# Patient Record
Sex: Female | Born: 1975 | Race: White | Hispanic: No | Marital: Married | Smoking: Never smoker
Health system: Southern US, Community
[De-identification: ages and names within clinical notes are randomized; demographics above are authoritative.]

## PROBLEM LIST (undated history)

## (undated) DIAGNOSIS — D869 Sarcoidosis, unspecified: Secondary | ICD-10-CM

## (undated) DIAGNOSIS — M1990 Unspecified osteoarthritis, unspecified site: Secondary | ICD-10-CM

## (undated) DIAGNOSIS — K219 Gastro-esophageal reflux disease without esophagitis: Secondary | ICD-10-CM

## (undated) DIAGNOSIS — J309 Allergic rhinitis, unspecified: Secondary | ICD-10-CM

## (undated) HISTORY — DX: Unspecified osteoarthritis, unspecified site: M19.90

## (undated) HISTORY — DX: Allergic rhinitis, unspecified: J30.9

## (undated) HISTORY — DX: Sarcoidosis, unspecified: D86.9

## (undated) HISTORY — DX: Gastro-esophageal reflux disease without esophagitis: K21.9

---

## 2014-01-10 ENCOUNTER — Ambulatory Visit: Admit: 2014-01-10 | Payer: HMO

## 2015-07-24 ENCOUNTER — Ambulatory Visit: Admitting: Internal Medicine

## 2015-07-24 ENCOUNTER — Ambulatory Visit

## 2015-07-24 NOTE — Progress Notes (Signed)
* * *        **  [name], Brandy**    --- ---    39 Y old Female, DOB: 03/26/1975    [address], [place], [place] [address]    Home: [phone]    Provider: Treyveon Mochizuki, MD        * * *    Telephone Encounter    ---    Answered by   Yu, Yuci  Date: 07/24/2015         Time: 04:10 PM    Reason   need PE    --- ---            Message                      last PE with Dr.Magdelena Kinsella was 01/10/2014.                Action Taken   Yu,Yuci 07/26/2015 3:04:07 PM > called pt, no respond, letter  sent.                * * *         **eMessages**   From:   Yu,Yuci    --- ---    Created:   2015-07-26 15:04:04    Sent:      Subject:   RE:need PE    Message:      SHAWN SHROFF, M.D.    Date: 07/26/2015    Brandy [name]    07/29/1975    [address]    [place] [address]    ???    Dear [name],    As your primary care physician, my goal is to continue to provide you with the  highest quality, safe, effective healthcare available and keep you as healthy  as possible. Not only is it important that I provide the best care possible  when you may become ill, but preventive health maintenance is a vital part of  your overall healthcare as well. A review or your medical record indicates  that you are due for a:    **    Routine Physical    **    Please contact our office at [phone] at your earliest convenience to  schedule an appointment.    Sincerely,    Office of Cordae Mccarey    Quality Team                        ---          * * *          Patient: [name], Brandy DOB: 05/21/1975 Provider: Vander Kueker, MD  07/24/2015    ---    Note generated by eClinicalWorks EMR/PM Software ([email or web address])

## 2015-07-24 NOTE — Progress Notes (Signed)
* * *        **  Teagle, Amelianna**    --- ---    39 Y old Female, DOB: 03/24/1975    [address], [place], [place] [address]    Home: [phone]    Provider: Denajah Farias, MD        * * *    Telephone Encounter    ---    Answered by   Yu, Yuci  Date: 07/24/2015         Time: 04:10 PM    Reason   need PE    --- ---            Message                      last PE with Dr.Lavaughn Bisig was 01/10/2014.                Action Taken   Yu,Yuci 07/26/2015 3:04:07 PM > called pt, no respond, letter  sent.                * * *         **eMessages**   From:   Yu,Yuci    --- ---    Created:   2015-07-26 15:04:04    Sent:      Subject:   RE:need PE    Message:      SHAWN SHROFF, M.D.    Date: 07/26/2015    Abcde Cousar    06/19/1975    [address]    [place] [address]    ???    Dear Karmina Alemu,    As your primary care physician, my goal is to continue to provide you with the  highest quality, safe, effective healthcare available and keep you as healthy  as possible. Not only is it important that I provide the best care possible  when you may become ill, but preventive health maintenance is a vital part of  your overall healthcare as well. A review or your medical record indicates  that you are due for a:    **    Routine Physical    **    Please contact our office at [phone] at your earliest convenience to  schedule an appointment.    Sincerely,    Office of Yehoshua Vitelli    Quality Team                        ---          * * *          Patient: Nored, Jessicia DOB: 12/30/1975 Provider: Bradely Rudin, MD  07/24/2015    ---    Note generated by eClinicalWorks EMR/PM Software ([email or web address])

## 2016-03-14 ENCOUNTER — Ambulatory Visit: Admitting: Obstetrics & Gynecology

## 2016-03-14 ENCOUNTER — Ambulatory Visit

## 2016-03-14 MED ORDER — Apri: Tablet | Freq: Every day | 0 refills | 0 days | Status: AC

## 2016-03-14 NOTE — Progress Notes (Signed)
* * *        **  Brandy [name]**    --- ---    3 Y old Female, DOB: 01-28-1976    [address] [place], [place] [address]    Home: [phone]    Provider: Fredrik Cove, MD        * * *    Telephone Encounter    ---    Answered by   Rodolph Bong  Date: 03/14/2016         Time: 02:25 PM    Reason   rx    --- ---            Action Taken   Bray,Paula 03/14/2016 2:25:35 PM > pt last seen 04/05/15 refill  sent apri with message needs annual            Refills  Start Apri Tablet, 0.15-30 MG-MCG, Orally, 84 Tablet, 1 tablet, Once  a day, 84 days, Refills=0    --- ---          * * *                ---          * * *          [name] DOB: 20-Apr-1975 Provider: Fredrik Cove, MD  03/14/2016    ---    Note generated by eClinicalWorks EMR/PM Software ([email or web address])

## 2016-03-14 NOTE — Progress Notes (Signed)
* * *      **  Brandy [name]**    ------    3 Y old Female, DOB: 1976/03/07    [address] [place], [place] [address]    Home: [phone]    Provider: Fredrik Cove, MD        * * *    Telephone Encounter    ---    Answered by  Rodolph Bong Date: 03/14/2016       Time: 02:25 PM    Reason  rx    ------            Action Taken  Bray,Paula 03/14/2016 2:25:35 PM > pt last seen 04/05/15 refill  sent apri with message needs annual            Refills Start Apri Tablet, 0.15-30 MG-MCG, Orally, 84 Tablet, 1 tablet, Once  a day, 84 days, Refills=0    ------          * * *                ---          * * *         [name] DOB: [birth year] Provider: Fredrik Cove, MD  03/14/2016    ---    Note generated by eClinicalWorks EMR/PM Software ([email or web address])

## 2016-03-21 ENCOUNTER — Ambulatory Visit

## 2016-03-21 ENCOUNTER — Ambulatory Visit: Admitting: Obstetrics & Gynecology

## 2016-03-21 NOTE — Progress Notes (Signed)
* * *        **  Brandy [name]**    --- ---    16 Y old Female, DOB: Mar 27, 1975    [address], [place], [place] [address]    Home: [phone]    Provider: Fredrik Cove, MD        * * *    Telephone Encounter    ---    Answered by   Darien Ramus  Date: 03/21/2016         Time: 10:44 AM    Caller   pt    --- ---            Reason   refill            Message                      pt has appt sched with KJ for 1/26, needs [facility] script sent to pharm in the meantime please   ([facility])                Action Taken   [facility] 03/21/2016 10:45:48 AM > Bray,Paula 03/21/2016  11:12:11 AM > refill apri for 3 months sent 03/14/16                * * *                ---          * * *          Patient: Brandy [name] DOB: 1975-11-24 Provider: Fredrik Cove, MD  03/21/2016    ---    Note generated by eClinicalWorks EMR/PM Software ([email or web address])

## 2016-03-21 NOTE — Progress Notes (Signed)
* * *      **  Brandy [name]**    ------    69 Y old Female, DOB: 11/30/1975    [address] [place], [place] [address]    Home: [phone]    Provider: Fredrik Cove, MD        * * *    Telephone Encounter    ---    Answered by  Darien Ramus Date: 03/21/2016       Time: 10:44 AM    Caller  pt    ------            Reason  refill            Message                     pt has appt sched with KJ for 1/26, needs [facility] script sent to pharm in the meantime please   ([facility]                Action Taken  [facility] 03/21/2016 10:45:48 AM > Bray,Paula 03/21/2016  11:12:11 AM > refill apri for 3 months sent 03/14/16                * * *                ---          * * *         Patient: Brandy [name] DOB: Apr 17, 1975 Provider: Fredrik Cove, MD  03/21/2016    ---    Note generated by eClinicalWorks EMR/PM Software ([email or web address])

## 2016-04-29 ENCOUNTER — Ambulatory Visit: Admit: 2016-04-29 | Payer: HMO

## 2016-04-29 ENCOUNTER — Ambulatory Visit

## 2016-04-29 ENCOUNTER — Ambulatory Visit: Admitting: Obstetrics & Gynecology

## 2016-04-29 MED ORDER — Apri: 90 | Freq: Every day | 3 refills | 0 days | Status: AC

## 2016-04-29 NOTE — Progress Notes (Signed)
* * *        Brandy [name]**    --- ---    41 Y old Female, DOB: June 15, 1975, External MRN: [id number]    Account Number: [id number]    [address] [place], [place]    Home: [phone]    Insurance: HMO [place] [employer] Payer ID: PAPER    PCP: Darcus Pester Referring: Darcus Pester External Visit ID: [id number]    Appointment Facility: [facility]        * * *    04/29/2016   **Appointment Provider:** Fredrik Cove, MD **CHN#:** [phone]    --- ---    ---        Current Medications    ---    Taking     * Apri 0.15-30 MG-MCG Tablet 1 tablet Orally Once a day    ---    * Ibuprofen 400 mg Tablet 1 tablet Orally as needed    ---    * Zyrtec 1 tab Oral     ---    Not-Taking/PRN    * Apri 0.15-30 MG-MCG Tablet 1 tablet Orally Once a day    ---    * Apri 0.15-30 MG-MCG Tablet 1 tablet Orally Once a day    ---    * Apri 0.15-30 MG-MCG Tablet 1 tablet Orally Once a day    ---    * Flonase 50 MCG/ACT Suspension 1 spray in each nostril Nasally Once a day    ---    * Meclizine HCl 25 mg Tablet 1 tablet as needed Orally Once a day    ---    * Medication List reviewed and reconciled with the patient    ---      Past Medical History    ---       Back pain/scoliosis.        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        ---      Surgical History    ---      C/S 04/2005    ---    C/S 12/2009    ---      Family History    ---      Mother: alive, diabetes, hyperlipidemia, hypertension    ---    Father: unknown    ---    Paternal Grand Mother: deceased    ---    Paternal Grand Father: deceased, colon CA, Breast CA, diagnosed with Cancer    ---    1 brother(s) , 1 sister(s) . 1 son(s) , 1 daughter(s) - healthy.    ---    mother-htn,dm    father-htn,drug alcohol    gf-breast cancer/colon    sister and one paternal cousin: kidney disease (heriditary, only one kidney)    denied pre-mature CAD in family.    ---      Social History    ---    Tobacco history: Never smoked.    Marital Status  Married.     Caffeine: drink coffee.    Sexual history  Currently active with your husband only, no Hx of STD.    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    Exercise: gym workouts,spinning- 3-4x/week.    Alcohol  Denies.    Illicit drugs: Denies.    Condom use: Never.    Driver's License/Permit: yes, drives regularly.    Seat Belts: always.  Lives with: Husband and children.   lives with husband and kids (5 and 8 YO  feel safe at home.    ---      Gyn History    ---    H/O Hx Abnormal pap smear 2000-colposcopy & repeat pap nl.    Birth control ocp.    GPAL: G2P2.    Last Pap Smear 03/29/14 negative/hpv negative.      Allergies    ---      Sulfa    ---    seasonal allergies    ---    [Allergies Verified]      Hospitalization/Major Diagnostic Procedure    ---      C/S 04/2005    ---    C/S 12/2009    ---      Review of Systems    ---     _[facility] ROS_ :    General: no fever, no weakness, no memory loss, no swollen glands, no  bruising, no weight loss, no constant fatigue, no physical abuse, no emotional  abuse. Skin: no changing moles, no rash, no jaundice. HEENT: no visual  problems, no eye pain, no ear pain, no difficulty hearing, no headaches, no  sinus trouble. Respiratory: no shortness of breath, no cough , no wheezing.  Breasts: no breast lumps, no breast pain, no nipple discharge. Cardiovascular:  no chest pains, no palpitations, no swelling, no dizziness, no murmurs.  Gastrointestinal: no nausea , no vomiting, no loss of appetite, no  constipation, no diarrhea. Female Genitourinary: __. Musculoskeletal: no joint  pain, no joint swelling, no joint stiffness. Neurological: __. Psychiatric: no  anxiety, no depression.    Patient intake reviewed with patient and scanned into system.        Reason for Appointment    ---      1\. Annual    ---      History of Present Illness    ---     _GENERAL_ :    41 yo married, + SA    bc- ocp    menses light.      Vital Signs    ---    Pain scale 0,  Ht-in 5'8, Wt-lbs 206, BMI 40.23, BP 122/80.      Physical Examination    ---     _[facility] Exam_ :    General: General appearance: well-appearing, no acute distress, Mental status:  alert and oriented, Mood/affect: pleasant. Neck: Thyroid: unremarkable, Neck  mass: none. Chest/Thorax: Breath sounds: clear to auscultation. Heart: Rythm:  regular, Murmurs: none, Rate: normal. Breasts: General: no masses, tenderness,  skin changes, or nipple abnormality, Skin: unremarkable, Axilla: no palpable  masses. Abdomen: General: soft, Tenderness: nontender, Masses: none, Hernia:  absent, Distention: none. Skin: General: warm, moist, no rash. Genitourinary:  External Genitalia: normal, Vagina: normal, pink/moist mucosa, no lesions, no  abnormal discharge, Urethral Meatus: normal, Urethra: normal, Bladder: normal,  Cervix: normal, Uterus: normal, Adnexa/Parametria: normal, Anus/Perinuem:  normal, Groin nodes: normal. Pap done.          Assessments    ---    1\. Encounter for gynecological examination - Z01.419 (Primary)    ---    2\. Oral contraceptive pill surveillance - Z30.41    ---      Treatment    ---       **1\. Encounter for gynecological examination**    Refill Apri Tablet, 0.15-30 MG-MCG, 1 tablet, Orally, Once a day, 90 days, 90,  Refills 3    _LAB: Cytology/Gynecological_    _LAB: HPV DNA_    _IMAGING: Mammogram - BILAT Digital_    ---       Preventive Medicine    ---      Patient instructed to call office within 2-3 weeks if she does not receive  results from today's visit. Explained results letter sent with all tests  performed in office. Patient agrees to call if she does not receive letter.        ---      Follow Up    ---    1 Year    **Appointment Provider:** Fredrik Cove, MD    Electronically signed by Dennison Bulla MD on 04/29/2016 at 11:23 AM EST    Sign off status: Completed        * * [facility]    [address] [place], [place] [address]    Tel: [phone]     Fax: [phone]              * * *          Patient: SHAKITA, KEIR DOB: [birth year] Progress Note: Fredrik Cove,  MD 04/29/2016    ---    Note generated by eClinicalWorks EMR/PM Software ([email or web address])

## 2016-04-29 NOTE — Progress Notes (Signed)
.  Progress Notes  .  Patient: Brandy [name], Brandy [name]  Provider: Fredrik Cove MD  .DOB: 29-Oct-1975 Age: 41 Y Sex: Female  .  PCP: Allean Found F   Date: 04/29/2016  .  --------------------------------------------------------------------------------  .  REASON FOR APPOINTMENT  .  1. Annual  .  HISTORY OF PRESENT ILLNESS  .  GENERAL:   42 yo married, + SAbc- ocpmenses light.  .  CURRENT MEDICATIONS  .  Taking Apri 0.15-30 MG-MCG Tablet 1 tablet Orally Once a day  Taking Ibuprofen 400 mg Tablet 1 tablet Orally as needed  Taking Zyrtec 1 tab Oral  Not-Taking/PRN Apri 0.15-30 MG-MCG Tablet 1 tablet Orally Once a  day  Not-Taking/PRN Apri 0.15-30 MG-MCG Tablet 1 tablet Orally Once a  day  Not-Taking/PRN Apri 0.15-30 MG-MCG Tablet 1 tablet Orally Once a  day  Not-Taking/PRN Flonase 50 MCG/ACT Suspension 1 spray in each  nostril Nasally Once a day  Not-Taking/PRN Meclizine HCl 25 mg Tablet 1 tablet as needed  Orally Once a day  Medication List reviewed and reconciled with the patient  .  PAST MEDICAL HISTORY  .  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  ALLERGIES  .  Sulfa  seasonal allergies  .  SURGICAL HISTORY  .  C/S 04/2005  C/S 12/2009  .  FAMILY HISTORY  .  Mother: alive, diabetes, hyperlipidemia, hypertension  Father: unknown  Paternal Grand Mother: deceased  Paternal Grand Father: deceased, colon CA, Breast CA, diagnosed  with Cancer  1 brother(s) , 1 sister(s) . 1 son(s) , 1 daughter(s) - healthy.  mother-htn,dmfather-htn,drug alcoholgf-breast cancer/colonsister  and one paternal cousin: kidney disease (heriditary, only one  kidney)denied pre-mature CAD in family.  .  SOCIAL HISTORY  .  .  Tobaccohistory:Never smoked.  .  Marital Status Married.  .  Caffeine: drink coffee.  Marland Kitchen  Sexual history Currently active with your husband only, no Hx of  STD.  .  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  Marland Kitchen  Exercise: gym workouts,spinning- 3-4x/week.  .  Alcohol  Denies.  .  Illicit drugs: Denies.  .  Condom use: Never.  .  Driver's License/Permit: yes, drives regularly.  .  Seat Belts: always.  .  Lives with: Husband and children.  .  lives with husband and kids (5 and 8 YO feel safe at home.  .  GYN HISTORY  .  H/O Hx Abnormal pap smear 2000-colposcopy & repeat pap nl  Birth control ocp  GPAL:  G2P2  Last Pap Smear 03/29/14 negative/hpv negative  .  HOSPITALIZATION/MAJOR DIAGNOSTIC PROCEDURE  .  C/S 04/2005  C/S 12/2009  .  REVIEW OF SYSTEMS  .  [facility] ROS:  .  General:    no fever, no weakness, no memory loss, no swollen  glands, no bruising, no weight loss, no constant fatigue, no  physical abuse, no emotional abuse . Skin:    no changing moles,  no rash, no jaundice . HEENT:    no visual problems, no eye pain,  no ear pain, no difficulty hearing, no headaches, no sinus  trouble . Respiratory:    no shortness of breath, no cough , no  wheezing . Breasts:    no breast lumps, no breast pain, no nipple  discharge . Cardiovascular:    no chest pains, no palpitations,  no swelling, no dizziness, no murmurs . Gastrointestinal:    no  nausea , no vomiting,  no loss of appetite, no constipation, no  diarrhea . Female Genitourinary:    __ . Musculoskeletal:    no  joint pain, no joint swelling, no joint stiffness . Neurological:     __ . Psychiatric:    no anxiety, no depression .  Marland Kitchen  Patient intake reviewed with patient and scanned into system.  Marland Kitchen  VITAL SIGNS  .  Pain scale 0, Ht-in 5'8, Wt-lbs 206, BMI 40.23, BP 122/80.  Marland Kitchen  PHYSICAL EXAMINATION  .  [facility] Exam:  General:  General appearance: well-appearing, no acute distress,  Mental status: alert and oriented, Mood/affect: pleasant. Neck:   Thyroid: unremarkable, Neck mass: none. Chest/Thorax:  Breath  sounds: clear to auscultation. Heart:  Rythm: regular, Murmurs:  none, Rate: normal. Breasts:  General: no masses, tenderness,  skin changes, or nipple abnormality, Skin: unremarkable, Axilla:  no palpable  masses. Abdomen:  General: soft, Tenderness:  nontender, Masses: none, Hernia: absent, Distention: none. Skin:   General: warm, moist, no rash. Genitourinary:  External  Genitalia: normal, Vagina: normal, pink/moist mucosa, no lesions,  no abnormal discharge, Urethral Meatus: normal, Urethra: normal,  Bladder: normal, Cervix: normal, Uterus: normal,  Adnexa/Parametria: normal, Anus/Perinuem: normal, Groin nodes:  normal. Pap done.  .  ASSESSMENTS  .  Encounter for gynecological examination - Z01.419 (Primary)  .  Oral contraceptive pill surveillance - Z30.41  .  TREATMENT  .  Encounter for gynecological examination  Refill Apri Tablet, 0.15-30 MG-MCG, 1 tablet, Orally, Once a day,  90 days, 90, Refills 3  LAB: Cytology/Gynecological  .  LAB: HPV DNA  .  Mammogram - BILAT [id number]  .  PREVENTIVE MEDICINE  .  Patient instructed to call office within 2-3 weeks if she does  not receive results from today's visit. Explained results letter  sent with all tests performed in office. Patient agrees to call  if she does not receive letter.  .  FOLLOW UP  .  1 Year  .  Marland Kitchen  Appointment Provider: Fredrik Cove, MD  .  Electronically signed by Dennison Bulla MD on  04/29/2016 at 11:23 AM EST  .  Document electronically signed by Fredrik Cove MD  .

## 2016-05-02 LAB — HX CYTO GYN & NON GYN

## 2016-07-11 ENCOUNTER — Ambulatory Visit: Admitting: Obstetrics & Gynecology

## 2016-07-11 ENCOUNTER — Ambulatory Visit

## 2016-07-11 NOTE — Progress Notes (Signed)
* * *        **  Brandy [name]**    --- ---    78 Y old Female, DOB: 06-09-1975    [address], [place], [place] [address]    Home: [phone]    Provider: Fredrik Cove, MD        * * *    Telephone Encounter    ---    Answered by   Teressa Senter  Date: 07/11/2016         Time: 03:24 PM    Reason   Switch OCP- check with Doristine Church    --- ---            Message                      Patient had annual with Dr. Alona Bene in February, has been getting menstrual headaches and would like to discuss changing OCP                Action Taken   [facility] 07/11/2016 3:24:29 PM > [phone] Puccio,Linda  , RN 07/11/2016 3:46:08 PM > pt has been on the same OCP since 2014 PAN,SUSANNA  , RN 07/14/2016 2:12:33 PM > l/m for pt to [facility] , RN 07/14/2016  2:35:16 PM > spoke to patient, pt report went to her PCP recently for  evaluation of headache, PCP did the full body woke up on her, everything is  fine. PCP believe headache might be d/t her OCP. Pt report she been  experiencing very bad headache while she is on her period. Pt denied any  dizziness or vision changes, however ibuprofen/Tylenol does not relieve the  headache. Advised pt if she is only getting headache while on sugar pills,  might not be d/t OCP, since sugar pills dose not contain hormones. Advised pt  I will check with dr. Alona Bene for her opinion. Maybe having pt try to take OCP  continuously? Parris,Julia 07/15/2016 4:33:10 PM > Discussed with KJ. Wants to  change her to Garnette Scheuermann due to placebos having a low dose of hormones still and  hopefully headaches will resolve. Patient is aware. No questions. Rx sent.            Refills  Start Kariva Tablet, 0.15-0.02/0.01 MG (21/5), Orally, 28, 1 tablet,  Once a day, 28 day(s), Refills=2    --- ---          * * *                ---          * * *          Patient: Brandy [name], Brandy [name] DOB: [birth year] Provider: Fredrik Cove, MD  07/11/2016    ---    Note generated by eClinicalWorks EMR/PM Software  ([email or web address])

## 2016-07-15 MED ORDER — Kariva: 28 | Freq: Every day | 2 refills | 0 days | Status: AC

## 2016-10-06 ENCOUNTER — Ambulatory Visit: Admitting: Obstetrics & Gynecology

## 2016-10-06 ENCOUNTER — Ambulatory Visit

## 2016-10-06 MED ORDER — Kariva: Tablet | Freq: Every day | 2 refills | 0 days | Status: AC

## 2016-10-06 NOTE — Progress Notes (Signed)
* * *        **  Brandy [name]**    --- ---    26 Y old Female, DOB: 09-Jul-1975    [address] [place], [place] [address]    Home: [phone]    Provider: Fredrik Cove, MD        * * *    Telephone Encounter    ---    Answered by   Rodolph Bong  Date: 10/06/2016         Time: 10:21 AM    Reason   refill    --- ---            Action Taken   Bray,Paula 10/06/2016 10:22:13 AM > pt seen for annual 04/29/16.  07/11/16 switched to kariva due to heacaches on placebo. refill sent kariva            Refills  Start Kariva Tablet, 0.15-0.02/0.01 MG (21/5), Orally, 84 Tablet, 1  tablet, Once a day, 84 days, Refills=2    --- ---          * * *                ---          * * *          Patient: Brandy [name], Brandy [name] DOB: 30-Oct-1975 Provider: Fredrik Cove, MD  10/06/2016    ---    Note generated by eClinicalWorks EMR/PM Software ([email or web address])

## 2017-06-11 ENCOUNTER — Ambulatory Visit: Admitting: Obstetrics & Gynecology

## 2017-06-11 ENCOUNTER — Ambulatory Visit

## 2017-06-11 MED ORDER — Kariva: Tablet | Freq: Every day | 0 refills | 0 days | Status: AC

## 2017-06-11 NOTE — Progress Notes (Signed)
* * *        **  Brandy [name]**    --- ---    54 Y old Female, DOB: 1976/03/15    [address] [place], [place] [address]    Home: [phone]    Provider: Fredrik Cove, MD        * * *    Telephone Encounter    ---    Answered by   Jeanene Erb  Date: 06/11/2017         Time: 01:52 PM    Reason   [facility] from [facility]    --- ---            Action Taken                      Rubin,Danielle , RN 06/11/2017 1:52:27 PM > Rx sent for one month supply. Pt due for annual.                Refills  Continue Kariva Tablet, 0.15-0.02/0.01 MG (21/5), Orally, 28 Tablet,  1 tablet, Once a day, 28 days, Refills=0    --- ---          * * *                ---          * * *          Patient: Brandy [name], Brandy [name] DOB: 03-12-1976 Provider: Fredrik Cove, MD  06/11/2017    ---    Note generated by eClinicalWorks EMR/PM Software ([email or web address])

## 2017-07-07 ENCOUNTER — Ambulatory Visit: Admitting: Obstetrics & Gynecology

## 2017-07-07 MED ORDER — Kariva: Tablet | Freq: Every day | 0 refills | 0 days | Status: AC

## 2017-07-07 NOTE — Progress Notes (Signed)
* * *        **  Brandy [name]**    --- ---    69 Y old Female, DOB: 06/28/1975    [address] [place], [place] [address]    Home: [phone]    Provider: Fredrik Cove, MD        * * *    Telephone Encounter    ---    Answered by   Etheleen Nicks  Date: 07/07/2017         Time: 01:39 PM    Caller   pt    --- ---            Reason   ocp refill            Message                      annual sched on 08/07/17.  please send one more month of Kariva.                Action Taken                      [facility]  07/07/2017 1:40:07 PM >       Rubin,Danielle , RN 07/07/2017 1:44:24 PM > rx sent                Refills  Continue Kariva Tablet, 0.15-0.02/0.01 MG (21/5), Orally, 28 Tablet,  1 tablet, Once a day, 28 days, Refills=0    --- ---          * * *                ---          * * *          Patient: Brandy [name], Brandy [name] DOB: 1976/01/16 Provider: Fredrik Cove, MD  07/07/2017    ---    Note generated by eClinicalWorks EMR/PM Software ([email or web address])

## 2017-08-07 ENCOUNTER — Ambulatory Visit: Admitting: Obstetrics & Gynecology

## 2017-08-07 MED ORDER — Apri: 30 | Freq: Every day | 0 refills | 0 days | Status: AC

## 2017-08-07 NOTE — Progress Notes (Signed)
* * *        **  [name]**    --- ---    44 Y old Female, DOB: 1976-02-26    [address] [place], [place] [address]    Home: [phone]    Provider: Fredrik Cove, MD        * * *    Telephone Encounter    ---    Answered by   Herminio Heads  Date: 08/07/2017         Time: 10:20 AM    Caller   Patient    --- ---            Reason   [facility]            Message                      Patient need refills sent to pharmacy, she is booked with [facility] on 6/7. Phone number and address confirmed.                Action Taken                      Brady,Ashley  08/07/2017 10:20:57 AM > RN      Rubin,Danielle , RN 08/07/2017 10:27:40 AM > one month rx sent                Refills  Continue Apri Tablet, 0.15-30 MG-MCG, Orally, 30, 1 tablet, Once a  day, 30, Refills=0    --- ---          * * *                ---          * * *          Patient: TOMMY, MINICHIELLO DOB: Jun 04, 1975 Provider: Fredrik Cove, MD  08/07/2017    ---    Note generated by eClinicalWorks EMR/PM Software ([email or web address])

## 2017-08-28 ENCOUNTER — Ambulatory Visit: Admitting: Obstetrics & Gynecology

## 2017-08-28 ENCOUNTER — Ambulatory Visit

## 2017-08-28 MED ORDER — Kariva: 90 | Freq: Every day | 3 refills | 0 days | Status: AC

## 2017-08-28 NOTE — Progress Notes (Signed)
* * *        Brandy [name]**    --- ---    42 Y old Female, DOB: 1975/09/17, External MRN: [id number]    Account Number: [id number]    [address] [place], [place]    Home: [phone]    Insurance: HMO [employer] Payer ID: PAPER    PCP: Bernette Mayers, MD Referring: Bernette Mayers, MD External Visit ID:  [id number]    Appointment Facility: [facility]        * * *    08/28/2017   **Appointment Provider:** Fredrik Cove, MD **CHN#:** [phone]    --- ---    ---         **Current Medications**    ---    Taking     * Augmentin     ---    * Benadryl     ---    * Kariva 0.15-0.02/0.01 MG (21/5) Tablet 1 tablet Orally Once a day    ---    * Sudafed , Notes: prn    ---    * Zyrtec 1 tab Oral     ---    Not-Taking/PRN    * Apri 0.15-30 MG-MCG Tablet 1 tablet Orally Once a day    ---    * Apri 0.15-30 MG-MCG Tablet 1 tablet Orally Once a day    ---    * Apri 0.15-30 MG-MCG Tablet 1 tablet Orally Once a day    ---    * Apri 0.15-30 MG-MCG Tablet 1 tablet Orally Once a day    ---    * Flonase 50 MCG/ACT Suspension 1 spray in each nostril Nasally Once a day    ---    * Ibuprofen 400 mg Tablet 1 tablet Orally as needed    ---    * Kariva 0.15-0.02/0.01 MG (21/5) Tablet 1 tablet Orally Once a day    ---    * Meclizine HCl 25 mg Tablet 1 tablet as needed Orally Once a day    ---    * Medication List reviewed and reconciled with the patient    ---      Past Medical History    ---       Back pain/scoliosis.        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        ---    Sinusitis.        ---       **Surgical History**    ---       C/S 04/2005    ---    C/S 12/2009    ---       **Family History**    ---       Mother: alive, diabetes, hyperlipidemia, hypertension    ---    Father: unknown    ---    Paternal Grand Mother: deceased    ---    Paternal Grand Father: deceased, colon CA, Breast CA, diagnosed with Cancer    ---    1 brother(s) , 1 sister(s) . 1 son(s) , 1 daughter(s) - healthy.     ---    mother-htn,dm    father-htn,drug alcohol    pgf-breast cancer    No Colon CA    sister and one paternal cousin: kidney disease (heriditary, only one kidney)    denied pre-mature CAD in family.    ---       **  Social History**    ---    Tobacco history: Never smoked.    Marital Status  Married.    Caffeine: drink coffee.    Sexual history  Currently active with your husband only, no Hx of STD.    Work/Occupation: Education officer, community office work.    Exercise: gym workouts,spinning- 3-4x/week.    Alcohol  Denies.    Illicit drugs: Denies.    Condom use: Never.    Driver's License/Permit: yes, drives regularly.    Seat Belts: always.    Lives with: Husband and children.   lives with husband and kids (5 and 8 YO  feel safe at home.    ---       **Gyn History**    ---    H/O Hx Abnormal pap smear 2000-colposcopy & repeat pap nl.    Birth control ocp.    GPAL: G2P2.    Last Pap Smear 04/2016- negative/hpv negative.      **Allergies**    ---       Sulfa    ---    seasonal allergies    ---    [Allergies Verified]       **Hospitalization/Major Diagnostic Procedure**    ---       C/S 04/2005    ---    C/S 12/2009    ---       **Review of Systems**    ---     _[facility]_ :    General: no fever, no weakness, no memory loss, no swollen glands, no  bruising, no weight loss, no constant fatigue, no physical abuse, no emotional  abuse. Skin: no changing moles, no rash, no jaundice. HEENT: no visual  problems, no eye pain, no ear pain, no difficulty hearing, no headaches, no  sinus trouble. Respiratory: no shortness of breath, no cough , no wheezing.  Breasts: no breast lumps, no breast pain, no nipple discharge. Cardiovascular:  no chest pains, no palpitations, no swelling, no dizziness, no murmurs.  Gastrointestinal: no nausea , no vomiting, no loss of appetite, no  constipation, no diarrhea. Female Genitourinary: __. Musculoskeletal: no joint  pain, no joint swelling, no joint stiffness. Neurological: __. Psychiatric:  no  anxiety, no depression.    Patient intake reviewed with patient and scanned into system.         **Reason for Appointment**    ---       1\. ANNUAL    ---       **History of Present Illness**    ---     _GENERAL_ :    42 yo married, + SA    bc- ocp    menses light.       **Vital Signs**    ---    Pain scale 0, Ht-in 5'8, Wt-lbs 207, BMI 40.42, BP 122/84, LMP: 3wks.       **Physical Examination**    ---     _[facility] Exam_ :    General: General appearance: well-appearing, no acute distress, Mental status:  alert and oriented, Mood/affect: pleasant. Neck: Thyroid: unremarkable, Neck  mass: none. Chest/Thorax: Breath sounds: clear to auscultation. Heart: Rythm:  regular, Murmurs: none, Rate: normal. Breasts: General: no masses, tenderness,  skin changes, or nipple abnormality, Skin: unremarkable, Axilla: no palpable  masses. Abdomen: General: soft, Tenderness: nontender, Masses: none, Hernia:  absent, Distention: none. Skin: General: warm, moist, no rash. Genitourinary:  External Genitalia: normal, Vagina: normal, pink/moist mucosa, no lesions, no  abnormal discharge, Urethral  Meatus: normal, Urethra: normal, Bladder: normal,  Cervix: normal, Uterus: normal, Adnexa/Parametria: normal, Anus/Perinuem:  normal, Groin nodes: normal. Pap done.          **Assessments**    ---    1\. Encounter for gynecological examination - Z01.419 (Primary)    ---    2\. Oral contraceptive pill surveillance - Z30.41    ---       **Treatment**    ---       **1\. Encounter for gynecological examination**    Refill Kariva Tablet, 0.15-0.02/0.01 MG (21/5), 1 tablet, Orally, Once a day,  90 days, 90, Refills 3    ---       **Preventive Medicine**    ---       Patient instructed to call office within 2-3 weeks if she does not receive  results from today's visit. Explained results letter sent with all tests  performed in office. Patient agrees to call if she does not receive letter.        ---       **Follow Up**    ---    1 Year     **Appointment Provider:** Fredrik Cove, MD    Electronically signed by Dennison Bulla MD on 08/28/2017 at 01:05 PM EDT    Sign off status: Completed        * * [facility]    [address] [place], [place] [address]    Tel: [phone]    Fax: [phone]              * * *          Patient: Brandy [name], Brandy [name] DOB: 04/29/1975 Progress Note: Fredrik Cove,  MD 08/28/2017    ---    Note generated by eClinicalWorks EMR/PM Software ([email or web address])

## 2017-08-28 NOTE — Progress Notes (Signed)
.  Progress Notes  .  Patient: Brandy [name], Brandy [name]  Provider: Fredrik Cove MD  .DOB: [birth year] Age: 42 Y Sex: Female  .  PCP: Bernette Mayers  MD  Date: 08/28/2017  .  --------------------------------------------------------------------------------  .  REASON FOR APPOINTMENT  .  1. ANNUAL  .  HISTORY OF PRESENT ILLNESS  .  GENERAL:   42 yo married, + SAbc- ocpmenses light.  .  CURRENT MEDICATIONS  .  Taking Augmentin  Taking Benadryl  Taking Kariva 0.15-0.02/0.01 MG (21/5) Tablet 1 tablet Orally  Once a day  Taking Sudafed , Notes: prn  Taking Zyrtec 1 tab Oral  Not-Taking/PRN Apri 0.15-30 MG-MCG Tablet 1 tablet Orally Once a  day  Not-Taking/PRN Apri 0.15-30 MG-MCG Tablet 1 tablet Orally Once a  day  Not-Taking/PRN Apri 0.15-30 MG-MCG Tablet 1 tablet Orally Once a  day  Not-Taking/PRN Apri 0.15-30 MG-MCG Tablet 1 tablet Orally Once a  day  Not-Taking/PRN Flonase 50 MCG/ACT Suspension 1 spray in each  nostril Nasally Once a day  Not-Taking/PRN Ibuprofen 400 mg Tablet 1 tablet Orally as needed  Not-Taking/PRN Kariva 0.15-0.02/0.01 MG (21/5) Tablet 1 tablet  Orally Once a day  Not-Taking/PRN Meclizine HCl 25 mg Tablet 1 tablet as needed  Orally Once a day  Medication List reviewed and reconciled with the patient  .  PAST MEDICAL HISTORY  .  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  ALLERGIES  .  Sulfa  seasonal allergies  .  SURGICAL HISTORY  .  C/S 04/2005  C/S 12/2009  .  FAMILY HISTORY  .  Mother: alive, diabetes, hyperlipidemia, hypertension  Father: unknown  Paternal Grand Mother: deceased  Paternal Grand Father: deceased, colon CA, Breast CA, diagnosed  with Cancer  1 brother(s) , 1 sister(s) . 1 son(s) , 1 daughter(s) - healthy.  mother-htn,dmfather-htn,drug alcoholpgf-breast cancerNo Colon CA  sister and one paternal cousin: kidney disease (heriditary, only  one kidney)denied pre-mature CAD in family.  .  SOCIAL HISTORY  .  .  Tobaccohistory:Never  smoked.  .  Marital Status Married.  .  Caffeine: drink coffee.  Marland Kitchen  Sexual history Currently active with your husband only, no Hx of  STD.  .  Work/Occupation: dentist office work.  .  Exercise: gym workouts,spinning- 3-4x/week.  .  Alcohol Denies.  .  Illicit drugs: Denies.  .  Condom use: Never.  .  Driver's License/Permit: yes, drives regularly.  .  Seat Belts: always.  .  Lives with: Husband and children.  .  lives with husband and kids (5 and 8 YO feel safe at home.  .  GYN HISTORY  .  H/O Hx Abnormal pap smear 2000-colposcopy & repeat pap nl  Birth control ocp  GPAL:  G2P2  Last Pap Smear 04/2016- negative/hpv negative  .  HOSPITALIZATION/MAJOR DIAGNOSTIC PROCEDURE  .  C/S 04/2005  C/S 12/2009  .  REVIEW OF SYSTEMS  .  [facility] ROS:  .  General:    no fever, no weakness, no memory loss, no swollen  glands, no bruising, no weight loss, no constant fatigue, no  physical abuse, no emotional abuse . Skin:    no changing moles,  no rash, no jaundice . HEENT:    no visual problems, no eye pain,  no ear pain, no difficulty hearing, no headaches, no sinus  trouble . Respiratory:    no shortness of breath, no cough , no  wheezing . Breasts:  no breast lumps, no breast pain, no nipple  discharge . Cardiovascular:    no chest pains, no palpitations,  no swelling, no dizziness, no murmurs . Gastrointestinal:    no  nausea , no vomiting, no loss of appetite, no constipation, no  diarrhea . Female Genitourinary:    __ . Musculoskeletal:    no  joint pain, no joint swelling, no joint stiffness . Neurological:     __ . Psychiatric:    no anxiety, no depression .  Marland Kitchen  Patient intake reviewed with patient and scanned into system.  Marland Kitchen  VITAL SIGNS  .  Pain scale 0, Ht-in 5'8, Wt-lbs 207, BMI 40.42, BP 122/84, LMP:  3wks.  .  PHYSICAL EXAMINATION  .  [facility] Exam:  General:  General appearance: well-appearing, no acute distress,  Mental status: alert and oriented, Mood/affect: pleasant. Neck:   Thyroid:  unremarkable, Neck mass: none. Chest/Thorax:  Breath  sounds: clear to auscultation. Heart:  Rythm: regular, Murmurs:  none, Rate: normal. Breasts:  General: no masses, tenderness,  skin changes, or nipple abnormality, Skin: unremarkable, Axilla:  no palpable masses. Abdomen:  General: soft, Tenderness:  nontender, Masses: none, Hernia: absent, Distention: none. Skin:   General: warm, moist, no rash. Genitourinary:  External  Genitalia: normal, Vagina: normal, pink/moist mucosa, no lesions,  no abnormal discharge, Urethral Meatus: normal, Urethra: normal,  Bladder: normal, Cervix: normal, Uterus: normal,  Adnexa/Parametria: normal, Anus/Perinuem: normal, Groin nodes:  normal. Pap done.  .  ASSESSMENTS  .  Encounter for gynecological examination - Z01.419 (Primary)  .  Oral contraceptive pill surveillance - Z30.41  .  TREATMENT  .  Encounter for gynecological examination  Refill Kariva Tablet, 0.15-0.02/0.01 MG (21/5), 1 tablet, Orally,  Once a day, 90 days, 90, Refills 3  .  PREVENTIVE MEDICINE  .  Patient instructed to call office within 2-3 weeks if she does  not receive results from today's visit. Explained results letter  sent with all tests performed in office. Patient agrees to call  if she does not receive letter.  .  FOLLOW UP  .  1 Year  .  Marland Kitchen  Appointment Provider: Fredrik Cove, MD  .  Electronically signed by Dennison Bulla MD on  08/28/2017 at 01:05 PM EDT  .  Document electronically signed by Fredrik Cove MD  .

## 2017-08-31 ENCOUNTER — Ambulatory Visit

## 2017-11-26 ENCOUNTER — Ambulatory Visit: Admitting: Obstetrics & Gynecology

## 2017-11-26 MED ORDER — Misoprostol: 200 | 2 | 0 refills | 0 days | Status: AC

## 2017-11-26 NOTE — Progress Notes (Signed)
* * *        **  [name]**    --- ---    55 Y old Female, DOB: Aug 02, 1975    [address], [place], [place] [address]    Home: [phone]    Provider: Fredrik Cove, MD        * * *    Telephone Encounter    ---    Answered by   Etheleen Nicks  Date: 11/26/2017         Time: 09:48 AM    Caller   pt    --- ---            Reason   ? about changing ocp            Message                      pt has a question about changing her ocp.  [phone]                Action Taken                      Detar North  11/26/2017 9:48:41 AM >       Mikey [employer] 11/26/2017 9:51:07 AM > pt has had a Mirena in the past, she would like to stop oral ocp and have Mirena reinserted because it's causing her h/a's and she's been having more clots with her periods. Can I have her make an appt. for this? Thank you! Nila Nephew, MD 11/26/2017 11:48:17 AM > She can be scheduled when she has menses, needs Misoprostol as had C/S x 2      [name] , RN 11/26/2017 11:59:05 AM > rx sent, pt transferred to front to make appt.                Refills  Start Misoprostol Tablet, 200 MCG, intravaginally, 2, as directed, 1  tab placed into vaginal night prior to procedure, 1 tab buccual 2 hours prior  to procedure, 2 days, Refills=0    --- ---          * * *                ---          * * *          Patient: Brandy [name], Brandy [name] DOB: 07-27-1975 Provider: Fredrik Cove, MD  11/26/2017    ---    Note generated by eClinicalWorks EMR/PM Software ([email or web address])

## 2017-12-25 ENCOUNTER — Ambulatory Visit: Admitting: Obstetrics & Gynecology

## 2017-12-25 NOTE — Progress Notes (Signed)
* * *        Brandy [name]**    --- ---    42 Y old Female, DOB: [birth year], External MRN: [id number]    Account Number: [id number]    [address] [place], [place]    Home: [phone]    Insurance: HMO [employer] OUT [employer] Payer ID: PAPER    PCP: Bernette Mayers, MD Referring: Bernette Mayers, MD External Visit ID:  [id number]    Appointment Facility: [facility]        * * *    12/25/2017   **Appointment Provider:** Fredrik Cove, MD **CHN#:** [phone]    --- ---    ---         **Current Medications**    ---    Taking     * Augmentin     ---    * Benadryl     ---    * Kariva 0.15-0.02/0.01 MG (21/5) Tablet 1 tablet Orally Once a day    ---    * Misoprostol 200 MCG Tablet as directed intravaginally 1 tab placed into vaginal night prior to procedure, 1 tab buccual 2 hours prior to procedure    ---    * Sudafed , Notes: prn    ---    * Zyrtec 1 tab Oral     ---    Not-Taking/PRN    * Apri 0.15-30 MG-MCG Tablet 1 tablet Orally Once a day    ---    * Apri 0.15-30 MG-MCG Tablet 1 tablet Orally Once a day    ---    * Apri 0.15-30 MG-MCG Tablet 1 tablet Orally Once a day    ---    * Apri 0.15-30 MG-MCG Tablet 1 tablet Orally Once a day    ---    * Flonase 50 MCG/ACT Suspension 1 spray in each nostril Nasally Once a day    ---    * Ibuprofen 400 mg Tablet 1 tablet Orally as needed    ---    * Kariva 0.15-0.02/0.01 MG (21/5) Tablet 1 tablet Orally Once a day    ---    * Meclizine HCl 25 mg Tablet 1 tablet as needed Orally Once a day    ---      Past Medical History    ---       Back pain/scoliosis.        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        ---    Sinusitis.        ---       **Surgical History**    ---       C/S 04/2005    ---    C/S 12/2009    ---       **Family History**    ---       Mother: alive, diabetes, hyperlipidemia, hypertension    ---    Father: unknown    ---    Paternal Grand Mother: deceased    ---    Paternal Grand Father: deceased, colon CA, Breast CA, diagnosed  with Cancer    ---    1 brother(s) , 1 sister(s) . 1 son(s) , 1 daughter(s) - healthy.    ---    mother-htn,dm    father-htn,drug alcohol    pgf-breast cancer    No Colon CA    sister and one paternal cousin: kidney disease (heriditary, only one kidney)  denied pre-mature CAD in family.    ---       **Social History**    ---    Lives with: Husband and children.    Exercise: gym workouts,spinning- 3-4x/week.    Sexual history  Currently active with your husband only, no Hx of STD.    Condom use: Never.    Work/Occupation: Education officer, community office work.    Alcohol  Denies.    Illicit drugs: Denies.    Seat Belts: always.    Driver's License/Permit: yes, drives regularly.    Caffeine: drink coffee.    Marital Status  Married.    Tobacco history: Never smoked.   lives with husband and kids (5 and 8 YO feel  safe at home.    ---       **Allergies**    ---       Sulfa    ---    seasonal allergies    ---    [Allergies Verified]       **Hospitalization/Major Diagnostic Procedure**    ---       C/S 04/2005    ---    C/S 12/2009    ---         **Reason for Appointment**    ---       1\. IUD PLCMNT-MIRENA    ---       **History of Present Illness**    ---     _GENERAL_ :    LMP 10/3    cytotec this AM.       **Vital Signs**    ---    Ht-in 5'8, Wt-lbs 213, BMI 41.59, BP 120/82, BSA 2.02, Wt-kg 96.62, Wt Change  6 lb, LMP: 12/24/17    upt neg.Kem Boroughs.       **Physical Examination**    ---     _OB/Gyn IUD Insertion_ :    Type of IUD inserted: Mirena. Procedure: The patient was counseled on the risk  and benfits of the IUD. She read literature regarding the risks and given  opportunity to ask questions before insertion. She gave her verbal and written  consent prior to insertion., A speculum was placed into the vagina. The cervix  was swabbed with betadine. A single-toothed tenaculum was used to grasp the  anterior lip of the cervix., The Mirena IUD was unable to pass internal os-  Kyleena IUD was inserted as the product instructions. The  inserter was removed  from the uterus and the strings were visible protruding from the cervical os.  The tenaculum was removed from the cervix. The IUD strings were cut. The  speculum was removed. Post procedure: patient tolerated procedure well. Uterus  sounded to: 7 cm. The inserter was removed from the uterus and the strings  were visible protruding from the cervical os approximately: 4 cm.          **Assessments**    ---    1\. Encounter for IUD insertion - Z30.430 (Primary)    ---       **Treatment**    ---       **1\. Encounter for IUD insertion**    _LAB: Urine Pregnancy Test , HCG Qual (UPREG)_     negative    --- ---       **Procedure Codes**    ---       M5784 INSRT LEVONORGESTREL INTRAUTRN SYS    ---    69629 URINE PREGNANCY TEST, Units: 2.00    ---    B2841 Rutha Bouchard,  19.5 mg    ---       **Follow Up**    ---    6 Weeks    **Appointment Provider:** Fredrik Cove, MD    Electronically signed by Dennison Bulla MD on 12/25/2017 at 02:14 PM EDT    Sign off status: Completed        * * [facility]    [address] [place], [place] [address]    Tel: [phone]    Fax: [phone]              * * *          Patient: Brandy [name], Brandy [name] DOB: 16-Jul-1975 Progress Note: Fredrik Cove,  MD 12/25/2017    ---    Note generated by eClinicalWorks EMR/PM Software ([email or web address])

## 2017-12-25 NOTE — Progress Notes (Signed)
.  Progress Notes  .  Patient: Brandy [name], Brandy [name]  Provider: Fredrik Cove MD  .DOB: 06-28-1975 Age: 42 Y Sex: Female  .  PCP: Bernette Mayers  MD  Date: 12/25/2017  .  --------------------------------------------------------------------------------  .  REASON FOR APPOINTMENT  .  1. IUD PLCMNT-MIRENA  .  HISTORY OF PRESENT ILLNESS  .  GENERAL:   LMP 10/3cytotec this AM.  .  CURRENT MEDICATIONS  .  Taking Augmentin  Taking Benadryl  Taking Kariva 0.15-0.02/0.01 MG (21/5) Tablet 1 tablet Orally  Once a day  Taking Misoprostol 200 MCG Tablet as directed intravaginally 1  tab placed into vaginal night prior to procedure, 1 tab buccual 2  hours prior to procedure  Taking Sudafed , Notes: prn  Taking Zyrtec 1 tab Oral  Not-Taking/PRN Apri 0.15-30 MG-MCG Tablet 1 tablet Orally Once a  day  Not-Taking/PRN Apri 0.15-30 MG-MCG Tablet 1 tablet Orally Once a  day  Not-Taking/PRN Apri 0.15-30 MG-MCG Tablet 1 tablet Orally Once a  day  Not-Taking/PRN Apri 0.15-30 MG-MCG Tablet 1 tablet Orally Once a  day  Not-Taking/PRN Flonase 50 MCG/ACT Suspension 1 spray in each  nostril Nasally Once a day  Not-Taking/PRN Ibuprofen 400 mg Tablet 1 tablet Orally as needed  Not-Taking/PRN Kariva 0.15-0.02/0.01 MG (21/5) Tablet 1 tablet  Orally Once a day  Not-Taking/PRN Meclizine HCl 25 mg Tablet 1 tablet as needed  Orally Once a day  .  PAST MEDICAL HISTORY  .  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  ALLERGIES  .  Sulfa  seasonal allergies  .  SURGICAL HISTORY  .  C/S 04/2005  C/S 12/2009  .  FAMILY HISTORY  .  Mother: alive, diabetes, hyperlipidemia, hypertension  Father: unknown  Paternal Grand Mother: deceased  Paternal Grand Father: deceased, colon CA, Breast CA, diagnosed  with Cancer  1 brother(s) , 1 sister(s) . 1 son(s) , 1 daughter(s) - healthy.  mother-htn,dmfather-htn,drug alcoholpgf-breast cancerNo Colon CA  sister and one paternal cousin: kidney disease (heriditary, only  one  kidney)denied pre-mature CAD in family.  .  SOCIAL HISTORY  .  Marland Kitchen  Lives with: Husband and children.  .  Exercise: gym workouts,spinning- 3-4x/week.  Marland Kitchen  Sexual history Currently active with your husband only, no Hx of  STD.  Marland Kitchen  Condom use: Never.  .  Work/Occupation: dentist office work.  .  Alcohol Denies.  .  Illicit drugs: Denies.  .  Seat Belts: always.  .  Driver's License/Permit: yes, drives regularly.  .  Caffeine: drink coffee.  .  Marital Status Married.  .  Tobaccohistory:Never smoked.  Marland Kitchen  lives with husband and kids (5 and 8 YO feel safe at home.  Marland Kitchen  HOSPITALIZATION/MAJOR DIAGNOSTIC PROCEDURE  .  C/S 04/2005  C/S 12/2009  .  VITAL SIGNS  .  Ht-in 5'8, Wt-lbs 213, BMI 41.59, BP 120/82, BSA 2.02, Wt-kg  96.62, Wt Change 6 lb, LMP: 10/3/19upt neg.[place].  .  PHYSICAL EXAMINATION  .  OB/Gyn IUD Insertion:  Type of IUD inserted:  Mirena. Procedure:  The patient was  counseled on the risk and benfits of the IUD. She read literature  regarding the risks and given opportunity to ask questions before  insertion. She gave her verbal and written consent prior to  insertion., A speculum was placed into the vagina. The cervix was  swabbed with betadine. A single-toothed tenaculum was used to  grasp the anterior lip of the cervix.,  The Mirena IUD was unable  to pass internal os- Kyleena IUD was inserted as the product  instructions. The inserter was removed from the uterus and the  strings were visible protruding from the cervical os. The  tenaculum was removed from the cervix. The IUD strings were cut.  The speculum was removed. Post procedure:  patient tolerated  procedure well. Uterus sounded to:  7 cm. The inserter was  removed from the uterus and the strings were visible protruding  from the cervical os approximately:  4 cm.  .  ASSESSMENTS  .  Encounter for IUD insertion - Z30.430 (Primary)  .  TREATMENT  .  Encounter for IUD insertion  LAB: Urine Pregnancy Test , HCG Qual (UPREG)  negative  .  PROCEDURE  CODES  .  Z6109 INSRT LEVONORGESTREL INTRAUTRN SYS  .  81025 URINE PREGNANCY TEST, Units: 2.00  .  J7296 Kyleena, 19.5 mg  .  FOLLOW UP  .  6 Weeks  .  Marland Kitchen  Appointment Provider: Fredrik Cove, MD  .  Electronically signed by Dennison Bulla MD on  12/25/2017 at 02:14 PM EDT  .  Document electronically signed by Fredrik Cove MD  .

## 2018-01-11 ENCOUNTER — Ambulatory Visit: Admitting: Obstetrics & Gynecology

## 2018-01-11 NOTE — Progress Notes (Signed)
* * *        **  Brandy [name]**    --- ---    64 Y old Female, DOB: Jan 30, 1976    [address], [place], [place] [address]    Home: [phone]    Provider: Fredrik Cove, MD        * * *    Telephone Encounter    ---    Answered by   Etheleen Nicks  Date: 01/11/2018         Time: 04:20 PM    Caller   pt    --- ---            Reason   ? about IUD-LMTC 10/21            Message                      pt has a question about her IUD that was placed on 12/25/17.  call her back at [phone]                Action Taken                      [facility]  01/11/2018 4:20:40 PM >       [employer] 01/11/2018 4:38:59 PM > LMTC      Veronesi,Wendy , RN 01/12/2018 2:45:12 PM > pt had IUD placed on 10/4 states she is having dark brown to brown d/c. Advised pt to monitor and to call us if she has any other symptoms or is having concerns                    * * *                ---          * * *          Patient: Brandy [name], Brandy [name] DOB: 26-Sep-1975 Provider: Fredrik Cove, MD  01/11/2018    ---    Note generated by eClinicalWorks EMR/PM Software ([email or web address])

## 2018-01-12 ENCOUNTER — Ambulatory Visit

## 2018-02-05 ENCOUNTER — Ambulatory Visit: Admitting: Obstetrics & Gynecology

## 2018-02-05 NOTE — Progress Notes (Signed)
.  Progress Notes  .  Patient: Brandy [name], Brandy [name]  Provider: Fredrik Cove MD  .DOB: 07/04/1975 Age: 42 Y Sex: Female  .  PCP: Bernette Mayers  MD  Date: 02/05/2018  .  --------------------------------------------------------------------------------  .  HISTORY OF PRESENT ILLNESS  .  GENERAL:   42yo had Kyleena placed 10/4LMP 11/12 very light.  .  CURRENT MEDICATIONS  .  Taking Augmentin  Taking Benadryl  Taking Kyleena  Taking Sudafed , Notes: prn  Taking Zyrtec 1 tab Oral  Not-Taking/PRN Apri 0.15-30 MG-MCG Tablet 1 tablet Orally Once a  day  Not-Taking/PRN Apri 0.15-30 MG-MCG Tablet 1 tablet Orally Once a  day  Not-Taking/PRN Apri 0.15-30 MG-MCG Tablet 1 tablet Orally Once a  day  Not-Taking/PRN Apri 0.15-30 MG-MCG Tablet 1 tablet Orally Once a  day  Not-Taking/PRN Flonase 50 MCG/ACT Suspension 1 spray in each  nostril Nasally Once a day  Not-Taking/PRN Ibuprofen 400 mg Tablet 1 tablet Orally as needed  Not-Taking/PRN Kariva 0.15-0.02/0.01 MG (21/5) Tablet 1 tablet  Orally Once a day  Not-Taking/PRN Kariva 0.15-0.02/0.01 MG (21/5) Tablet 1 tablet  Orally Once a day  Not-Taking/PRN Meclizine HCl 25 mg Tablet 1 tablet as needed  Orally Once a day  Not-Taking/PRN Mirena  Not-Taking/PRN Misoprostol 200 MCG Tablet as directed  intravaginally 1 tab placed into vaginal night prior to  procedure, 1 tab buccual 2 hours prior to procedure  Medication List reviewed and reconciled with the patient  .  PAST MEDICAL HISTORY  .  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  ALLERGIES  .  Sulfa  seasonal allergies  .  SURGICAL HISTORY  .  C/S 04/2005  C/S 12/2009  .  FAMILY HISTORY  .  Mother: alive, diabetes, hyperlipidemia, hypertension  Father: unknown  Paternal Grand Mother: deceased  Paternal Grand Father: deceased, colon CA, Breast CA, diagnosed  with Cancer  1 brother(s) , 1 sister(s) . 1 son(s) , 1 daughter(s) - healthy.  mother-htn,dmfather-htn,drug alcoholpgf-breast cancerNo  Colon CA  sister and one paternal cousin: kidney disease (heriditary, only  one kidney)denied pre-mature CAD in family.  .  SOCIAL HISTORY  .  Marland Kitchen  Lives with: Husband and children.  .  Exercise: gym workouts,spinning- 3-4x/week.  Marland Kitchen  Sexual history Currently active with your husband only, no Hx of  STD.  Marland Kitchen  Condom use: Never.  .  Work/Occupation: dentist office work.  .  Alcohol Denies.  .  Illicit drugs: Denies.  .  Seat Belts: always.  .  Driver's License/Permit: yes, drives regularly.  .  Caffeine: drink coffee.  .  Marital Status Married.  .  Tobaccohistory:Never smoked.  Marland Kitchen  lives with husband and kids (5 and 8 YO feel safe at home.  Marland Kitchen  HOSPITALIZATION/MAJOR DIAGNOSTIC PROCEDURE  .  C/S 04/2005  C/S 12/2009  .  REVIEW OF SYSTEMS  .  WCS General::  .  Not Present:    Fatigue, Fever, Night Sweats, Weight Gain > 10  lbs, Weight Loss > 10 lbs .  Marland Kitchen  WCS Gastrointestinal::  .  Not Present:    Abdominal Pain, Abdominal Bloating, Bloody Stool,  Change in Bowel Habits, Constipation, Diarrhea, Gas, Indigestion,  Nausea, Rectal Bleeding, Vomiting .  Marland Kitchen  [facility] Female Genitourinary::  .  Not Present:    Dyspareunia, Dysuria, Abnormal Vaginal Discharge,  Frequency, Hematuria, Incontinence, Menstrual Irregularities,  Urgency, Urinary Retention, Vaginal Bleeding .  Marland Kitchen  VITAL SIGNS  .  Ht-in 5'8, Wt-lbs 213, BMI 41.59, BP 116/82, BSA 2.02, Wt-kg  96.62, LMP: 02/02/18.  .  PHYSICAL EXAMINATION  .  OB/Gyn General:  General Appearance:  well-appearing, no acute distress. Mental  Status:  alert and oriented. Mood/Affect:  pleasant.  OB/Gyn Abdomen:  General:  soft. Tenderness:  nontender. Masses:  none.  OB/Gyn Genitourinary:  External Genitalia:  normal. Vagina:  normal, pink/moist mucosa,  no lesions, no abnormal discharge. Cervix:  normal appearance, no  lesions/discharge/bleeding, no cervical motion tenderness, IUD  string visualized. Uterus:  normal size/shape/consistency.  Adnexa:  no mass or tenderness bilaterally. Urethra    Normal.  Bladder  Normal .  .  ASSESSMENTS  .  IUD (intrauterine device) in place - Z97.5 (Primary)  .  Marland Kitchen  Appointment Provider: Fredrik Cove, MD  .  Electronically signed by Dennison Bulla MD on  02/05/2018 at 11:47 AM EST  .  Document electronically signed by Fredrik Cove MD  .

## 2018-02-05 NOTE — Progress Notes (Signed)
* * *        Brandy [name]**    --- ---    64 Y old Female, DOB: 1975/11/24, External MRN: [id number]    Account Number: [id number]    [address] [place], [place]    Home: [phone]    Insurance: HMO [place] [employer] Payer ID: PAPER    PCP: Bernette Mayers, MD Referring: Bernette Mayers, MD External Visit ID:  [id number]    Appointment Facility: [facility]        * * *    02/05/2018   **Appointment Provider:** Fredrik Cove, MD **CHN#:** [phone]    --- ---    ---         **Current Medications**    ---    Taking     * Augmentin     ---    * Benadryl     ---    Rutha Bouchard     ---    * Sudafed , Notes: prn    ---    * Zyrtec 1 tab Oral     ---    Not-Taking/PRN    * Apri 0.15-30 MG-MCG Tablet 1 tablet Orally Once a day    ---    * Apri 0.15-30 MG-MCG Tablet 1 tablet Orally Once a day    ---    * Apri 0.15-30 MG-MCG Tablet 1 tablet Orally Once a day    ---    * Apri 0.15-30 MG-MCG Tablet 1 tablet Orally Once a day    ---    * Flonase 50 MCG/ACT Suspension 1 spray in each nostril Nasally Once a day    ---    * Ibuprofen 400 mg Tablet 1 tablet Orally as needed    ---    * Kariva 0.15-0.02/0.01 MG (21/5) Tablet 1 tablet Orally Once a day    ---    * Kariva 0.15-0.02/0.01 MG (21/5) Tablet 1 tablet Orally Once a day    ---    * Meclizine HCl 25 mg Tablet 1 tablet as needed Orally Once a day    ---    * Mirena     ---    * Misoprostol 200 MCG Tablet as directed intravaginally 1 tab placed into vaginal night prior to procedure, 1 tab buccual 2 hours prior to procedure    ---    * Medication List reviewed and reconciled with the patient    ---      Past Medical History    ---       Back pain/scoliosis.        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        ---    Sinusitis.        ---       **Surgical History**    ---       C/S 04/2005    ---    C/S 12/2009    ---       **Family History**    ---       Mother: alive, diabetes, hyperlipidemia, hypertension    ---    Father: unknown    ---     Paternal Grand Mother: deceased    ---    Paternal Grand Father: deceased, colon CA, Breast CA, diagnosed with Cancer    ---    1 brother(s) , 1 sister(s) . 1 son(s) , 1 daughter(s) - healthy.    ---  mother-htn,dm    father-htn,drug alcohol    pgf-breast cancer    No Colon CA    sister and one paternal cousin: kidney disease (heriditary, only one kidney)    denied pre-mature CAD in family.    ---       **Social History**    ---    Lives with: Husband and children.    Exercise: gym workouts,spinning- 3-4x/week.    Sexual history  Currently active with your husband only, no Hx of STD.    Condom use: Never.    Work/Occupation: Education officer, community office work.    Alcohol  Denies.    Illicit drugs: Denies.    Seat Belts: always.    Driver's License/Permit: yes, drives regularly.    Caffeine: drink coffee.    Marital Status  Married.    Tobacco history: Never smoked.   lives with husband and kids (5 and 8 YO feel  safe at home.    ---       **Allergies**    ---       Sulfa    ---    seasonal allergies    ---    [Allergies Verified]       **Hospitalization/Major Diagnostic Procedure**    ---       C/S 04/2005    ---    C/S 12/2009    ---       **Review of Systems**    ---     _WCS General:_ :    Not Present: Fatigue, Fever, Night Sweats, Weight Gain > 10 lbs, Weight Loss >  10 lbs.    _WCS Gastrointestinal:_ :    Not Present: Abdominal Pain, Abdominal Bloating, Bloody Stool, Change in Bowel  Habits, Constipation, Diarrhea, Gas, Indigestion, Nausea, Rectal Bleeding,  Vomiting.    _WCS Female Genitourinary:_ :    Not Present: Dyspareunia, Dysuria, Abnormal Vaginal Discharge, Frequency,  Hematuria, Incontinence, Menstrual Irregularities, Urgency, Urinary Retention,  Vaginal Bleeding.            **History of Present Illness**    ---     _GENERAL_ :    42yo had Kyleena placed 10/4    LMP 11/12 very light.       **Vital Signs**    ---    Ht-in 5'8, Wt-lbs 213, BMI 41.59, BP 116/82, BSA 2.02, Wt-kg 96.62, LMP:  02/02/18.        **Physical Examination**    ---     _OB/Gyn General_ :    General Appearance: well-appearing, no acute distress. Mental Status: alert  and oriented. Mood/Affect: pleasant.    _OB/Gyn Abdomen_ :    General: soft. Tenderness: nontender. Masses: none.    _OB/Gyn Genitourinary_ :    External Genitalia: normal. Vagina: normal, pink/moist mucosa, no lesions, no  abnormal discharge. Cervix: normal appearance, no lesions/discharge/bleeding,  no cervical motion tenderness, IUD string visualized. Uterus: normal  size/shape/consistency. Adnexa: no mass or tenderness bilaterally. Urethra  Normal. Bladder Normal .          **Assessments**    ---    1\. IUD (intrauterine device) in place - Z97.5 (Primary)    ---    **Appointment Provider:** Fredrik Cove, MD    Electronically signed by Dennison Bulla MD on 02/05/2018 at 11:47 AM EST    Sign off status: Completed        * * *        [facility]    [address] [place]  [place], [place] [address]    Tel: [phone]    Fax: [phone]              * * *          Patient: Brandy [name], Brandy [name] DOB: Sep 07, 1975 Progress Note: Fredrik Cove,  MD 02/05/2018    ---    Note generated by eClinicalWorks EMR/PM Software ([email or web address])

## 2018-09-03 ENCOUNTER — Ambulatory Visit

## 2018-11-25 ENCOUNTER — Ambulatory Visit

## 2018-12-03 ENCOUNTER — Ambulatory Visit

## 2018-12-03 NOTE — Progress Notes (Signed)
 .  Progress Notes  .  Patient: Brandy [name], Brandy [name]  Provider: Fredrik Cove MD  .DOB: [birth year] Age: 43 Y Sex: Female  .  PCP: Bernette Mayers  MD  Date: 09/03/2018  .  --------------------------------------------------------------------------------  .  REASON FOR APPOINTMENT  .  1. LAST ANNUAL 08/28/17  .  HISTORY OF PRESENT ILLNESS  .  GENERAL:   Kyleena placed 10/4LMP 11/12- very light no HAs.  Marland Kitchen  CURRENT MEDICATIONS  .  Taking Benadryl  Taking Flonase 50 MCG/ACT Suspension 1 spray in each nostril  Nasally Once a day  Taking Kyleena  Taking Meclizine HCl 25 mg Tablet 1 tablet as needed Orally Once  a day  Taking Sudafed , Notes: prn  Not-Taking/PRN Apri 0.15-30 MG-MCG Tablet 1 tablet Orally Once a  day  Not-Taking/PRN Apri 0.15-30 MG-MCG Tablet 1 tablet Orally Once a  day  Not-Taking/PRN Apri 0.15-30 MG-MCG Tablet 1 tablet Orally Once a  day  Not-Taking/PRN Apri 0.15-30 MG-MCG Tablet 1 tablet Orally Once a  day  Not-Taking/PRN Augmentin  Not-Taking/PRN Ibuprofen 400 mg Tablet 1 tablet Orally as needed  Not-Taking/PRN Kariva 0.15-0.02/0.01 MG (21/5) Tablet 1 tablet  Orally Once a day  Not-Taking/PRN Kariva 0.15-0.02/0.01 MG (21/5) Tablet 1 tablet  Orally Once a day  Not-Taking/PRN Kyleena  Not-Taking/PRN Mirena  Not-Taking/PRN Misoprostol 200 MCG Tablet as directed  intravaginally 1 tab placed into vaginal night prior to  procedure, 1 tab buccual 2 hours prior to procedure  Unknown Zyrtec 1 tab Oral  Medication List reviewed and reconciled with the patient  .  PAST MEDICAL HISTORY  .  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  ALLERGIES  .  Sulfa  seasonal allergies  .  SURGICAL HISTORY  .  C/S 04/2005  C/S 12/2009  .  FAMILY HISTORY  .  Mother: alive, diabetes, hyperlipidemia, hypertension  Father: unknown  Paternal Grand Mother: deceased  Paternal Grand Father: deceased, colon CA, Breast CA, diagnosed  with Cancer  1 brother(s) , 1 sister(s) . 1 son(s) , 1 daughter(s) -  healthy.  mother-htn,dmfather-htn,drug alcoholpgf-breast cancerNo Colon CA  sister and one paternal cousin: kidney disease (heriditary, only  one kidney)denied pre-mature CAD in family.  .  SOCIAL HISTORY  .  Marland Kitchen  Lives with: Husband and children.  .  Exercise: gym workouts,spinning- 3-4x/week.  Marland Kitchen  Sexual history Currently active with your husband only, no Hx of  STD.  Marland Kitchen  Condom use: Never.  .  Work/Occupation: dentist office work.  .  Alcohol Denies.  .  Illicit drugs: Denies.  .  Seat Belts: always.  .  Driver's License/Permit: yes, drives regularly.  .  Caffeine: drink coffee.  .  Marital Status Married.  .  Tobaccohistory:Never smoked.  .  GYN HISTORY  .  H/O Hx Abnormal pap smear 2000-colposcopy & repeat pap nl  Birth control 12/2017 Kyleena  GPAL:  G2P2  Last Pap Smear 04/2016- negative/hpv negative  .  HOSPITALIZATION/MAJOR DIAGNOSTIC PROCEDURE  .  C/S 04/2005  C/S 12/2009  .  REVIEW OF SYSTEMS  .  WCS General::  .  Not Present:    Fatigue, Fever, Night Sweats, Weight Gain > 10  lbs, Weight Loss > 10 lbs .  Marland Kitchen  WCS Gastrointestinal::  .  Not Present:    Abdominal Pain, Abdominal Bloating, Bloody Stool,  Change in Bowel Habits, Constipation, Diarrhea, Gas, Indigestion,  Nausea, Rectal Bleeding, Vomiting .  Marland Kitchen  [facility] Female Genitourinary::  .  Not Present:    Dyspareunia, Dysuria, Abnormal Vaginal Discharge,  Frequency, Hematuria, Incontinence, Menstrual Irregularities,  Urgency, Urinary Retention, Vaginal Bleeding .  Marland Kitchen  PHYSICAL EXAMINATION  .  OB/Gyn General:  General Appearance:  well-appearing, no acute distress. Mental  Status:  alert and oriented. Mood/Affect:  pleasant.  OB/Gyn Abdomen:  General:  soft. Tenderness:  nontender. Masses:  none.  OB/Gyn Genitourinary:  External Genitalia:  normal. Vagina:  normal, pink/moist mucosa,  no lesions, no abnormal discharge. Cervix:  normal appearance, no  lesions/discharge/bleeding, no cervical motion tenderness, IUD  string visualized. Uterus:  normal  size/shape/consistency.  Adnexa:  no mass or tenderness bilaterally. Urethra   Normal.  Bladder  Normal .  .  ASSESSMENTS  .  IUD (intrauterine device) in place - Z97.5 (Primary)  .  Marland Kitchen  Appointment Provider: Fredrik Cove, MD  .  Electronically signed by Dennison Bulla MD on  02/05/2018 at 09:47 AM EST  .  Document electronically signed by Fredrik Cove MD  .

## 2019-02-04 ENCOUNTER — Ambulatory Visit: Admit: 2019-02-04 | Payer: HMO

## 2019-02-04 ENCOUNTER — Ambulatory Visit: Admitting: Obstetrics & Gynecology

## 2019-02-04 NOTE — Progress Notes (Signed)
 Brandy [name], Monta **DOB:** 12-21-1975 (43 yo F) **Acc No.** [id number] **DOS:**  02/04/2019    ---       Brandy [name], Brandy [name]**    ------    62 Y old Female, DOB: 07-16-1975, External MRN: [id number]    Account Number: [id number]    [address] [place], [place]    Home: [phone]    Insurance: HMO [employer] Payer ID: PAPER    PCP: Bernette Mayers, MD Referring: Bernette Mayers, MD External Visit ID:  [id number]    Appointment Facility: [facility]        * * *    02/04/2019  **Appointment Provider:** Fredrik Cove, MD **CHN#:** [phone]    ------    ---       **Current Medications**    ---    Taking    * Benadryl     ---    * Flonase 50 MCG/ACT Suspension 1 spray in each nostril Nasally Once a day    ---    Rutha Bouchard     ---    * Sudafed , Notes: prn    ---    * Zyrtec     ---    Not-Taking/PRN    * Apri 0.15-30 MG-MCG Tablet 1 tablet Orally Once a day    ---    * Apri 0.15-30 MG-MCG Tablet 1 tablet Orally Once a day    ---    * Apri 0.15-30 MG-MCG Tablet 1 tablet Orally Once a day    ---    * Apri 0.15-30 MG-MCG Tablet 1 tablet Orally Once a day    ---    * Augmentin     ---    * Ibuprofen 400 mg Tablet 1 tablet Orally as needed    ---    * Kariva 0.15-0.02/0.01 MG (21/5) Tablet 1 tablet Orally Once a day    ---    * Kariva 0.15-0.02/0.01 MG (21/5) Tablet 1 tablet Orally Once a day    ---    Rutha Bouchard     ---    Rutha Bouchard     ---    * Meclizine HCl 25 mg Tablet 1 tablet as needed Orally Once a day    ---    * Mirena     ---    * Misoprostol 200 MCG Tablet as directed intravaginally 1 tab placed into vaginal night prior to procedure, 1 tab buccual 2 hours prior to procedure    ---    Unknown    * Zyrtec 1 tab Oral     ---    * Medication List reviewed and reconciled with the patient    ---     Past Medical History    ---      Back pain/scoliosis.        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        ---    Sinusitis.        ---      **Surgical History**     ---      C/S 04/2005    ---    C/S 12/2009    ---      **Family History**    ---      Mother: alive, diabetes, hyperlipidemia, hypertension    ---    Father: unknown    ---    Paternal Grand Mother: deceased    ---  Paternal Grand Father: deceased, colon CA, Breast CA, diagnosed with Other  malignant neoplasm of unspecified site    ---    1 brother(s) , 1 sister(s) . 1 son(s) , 1 daughter(s) - healthy.    ---    mother-htn,dm    father-htn,drug alcohol    pgf-breast cancer    No Colon CA    sister and one paternal cousin: kidney disease (heriditary, only one kidney)    denied pre-mature CAD in family.    ---      **Social History**    ---    Tobacco history: Never smoked.    Marital Status  Married.    Caffeine: drink coffee.    Sexual history  Currently active with your husband only, no Hx of STD.    Work/Occupation: Education officer, community office work.    Exercise: gym workouts,spinning- 3-4x/week.    Alcohol  Denies.    Illicit drugs: Denies.    Condom use: Never.    Driver's License/Permit: yes, drives regularly.    Seat Belts: always.    Lives with: Husband and children.  lives with husband and kids (5 and 8 YO  feel safe at home.    ---      **Gyn History**    ---    H/O Hx Abnormal pap smear 2000-colposcopy & repeat pap nl.    Birth control 12/2017 Kyleena.    GPAL: G2P2.    Last Pap Smear 04/2016- negative/hpv negative.    Last Mammogram [facility]- had bx- has 6mos f/u 06/2019.     **Allergies**    ---      Sulfa    ---    seasonal allergies    ---    [Allergies Verified]      **Hospitalization/Major Diagnostic Procedure**    ---      C/S 04/2005    ---    C/S 12/2009    ---      **Review of Systems**    ---     _[facility]_ :    General: no fever, no weakness, no memory loss, no swollen glands, no  bruising, no weight loss, no constant fatigue, no physical abuse, no emotional  abuse. Skin: no changing moles, no rash, no jaundice. HEENT: no visual  problems, no eye pain, no ear pain, no difficulty  hearing, no headaches, no  sinus trouble. Respiratory: no shortness of breath, no cough , no wheezing.  Breasts: no breast lumps, no breast pain, no nipple discharge. Cardiovascular:  no chest pains, no palpitations, no swelling, no dizziness, no murmurs.  Gastrointestinal: no nausea , no vomiting, no loss of appetite, no  constipation, no diarrhea. Female Genitourinary: NO , Dyspareunia, Dysuria,  Abnormal vaginal discharge, Menstrual irregularities. Musculoskeletal: no  joint pain, no joint swelling, no joint stiffness. Neurological: __.  Psychiatric: no anxiety, no depression.    Patient intake reviewed with patient and scanned into system.       **Reason for Appointment**    ---      1\. LAST 08/2017    ---      **History of Present Illness**    ---     _GENERAL_ :    43 yo G2P2 married +SA    Kyleena placed 12/25/17    very light spotting    mammogram @ [facility]- dense breasts, cysts, had bx & f/u scheduled in 6 mos.      **Vital Signs**    ---    Ht-in 5'8, Wt-lbs  221, BMI 43.16, BP 120/82, BSA 2.06, Wt-kg 100.24, Wt Change  8 lb, LMP: 02/03/19.      **Physical Examination**    ---     _[facility] Exam_ :    General: General appearance: well-appearing, no acute distress, Mental status:  alert and oriented, Mood/affect: pleasant. Neck: Thyroid: unremarkable, Neck  mass: none. Breasts: General: no masses, tenderness, skin changes, or nipple  abnormality, Skin: unremarkable, Axilla: no palpable masses. Abdomen: General:  soft, Tenderness: nontender, Masses: none, Hernia: absent, Distention: none.  Skin: General: warm, moist, no rash. Genitourinary: External Genitalia:  normal, Vagina: normal, pink/moist mucosa, no lesions, no abnormal discharge,  Urethral Meatus: normal, Urethra: normal, Bladder: normal, Cervix: normal, +  IUD string visualized, Uterus: normal, Adnexa/Parametria: normal,  Anus/Perinuem: normal, Groin nodes: normal. Pap done.         **Assessments**    ---    1\. Encounter for  gynecological examination - Z01.419 (Primary)    ---      **Treatment**    ---      **1\. Encounter for gynecological examination**    _LAB: Cytology/Gynecological_ pap smear negative/hpv negative    Barron,Jennifer , RN 02/09/2019 8:51:09 AM > Fredrik Cove, MD 02/12/2019  10:27:10 PM >    ------    _LAB: HPV DNA_      **Preventive Medicine**    ---      Patient instructed to call office within 2-3 weeks if she does not receive  results from today's visit. Explained results letter sent with all tests  performed in office. Patient agrees to call if she does not receive letter.        ---      **Follow Up**    ---    1 Year    **Appointment Provider:** Fredrik Cove, MD    Electronically signed by Dennison Bulla , MD on 07/08/2019 at 10:43 AM EDT    Sign off status: Completed        * * [facility]    [address] [place], [place] [address]    Tel: [phone]    Fax: [phone]              * * *          Progress Note: Fredrik Cove, MD 02/04/2019    ---    Note generated by eClinicalWorks EMR/PM Software ([email or web address])

## 2019-02-04 NOTE — Progress Notes (Signed)
 .  Progress Notes  .  Patient: Brandy [name], Brandy [name]  Provider: Fredrik Cove MD  .DOB: 1976/02/05 Age: 43 Y Sex: Female  .  PCP: Bernette Mayers  MD  Date: 02/04/2019  .  --------------------------------------------------------------------------------  .  REASON FOR APPOINTMENT  .  1. LAST 08/2017  .  HISTORY OF PRESENT ILLNESS  .  GENERAL:   43 yo G2P2 married +SAKyleena placed 12/25/17 very light spotting  mammogram @ [facility]- dense breasts, cysts, bad bx & f/u scheduled  in 6 mos.  .  CURRENT MEDICATIONS  .  Taking Benadryl  Taking Flonase 50 MCG/ACT Suspension 1 spray in each nostril  Nasally Once a day  Taking Kyleena  Taking Sudafed , Notes: prn  Taking Zyrtec  Not-Taking/PRN Apri 0.15-30 MG-MCG Tablet 1 tablet Orally Once a  day  Not-Taking/PRN Apri 0.15-30 MG-MCG Tablet 1 tablet Orally Once a  day  Not-Taking/PRN Apri 0.15-30 MG-MCG Tablet 1 tablet Orally Once a  day  Not-Taking/PRN Apri 0.15-30 MG-MCG Tablet 1 tablet Orally Once a  day  Not-Taking/PRN Augmentin  Not-Taking/PRN Ibuprofen 400 mg Tablet 1 tablet Orally as needed  Not-Taking/PRN Kariva 0.15-0.02/0.01 MG (21/5) Tablet 1 tablet  Orally Once a day  Not-Taking/PRN Kariva 0.15-0.02/0.01 MG (21/5) Tablet 1 tablet  Orally Once a day  Not-Taking/PRN Kyleena  Not-Taking/PRN Kyleena  Not-Taking/PRN Meclizine HCl 25 mg Tablet 1 tablet as needed  Orally Once a day  Not-Taking/PRN Mirena  Not-Taking/PRN Misoprostol 200 MCG Tablet as directed  intravaginally 1 tab placed into vaginal night prior to  procedure, 1 tab buccual 2 hours prior to procedure  Unknown Zyrtec 1 tab Oral  Medication List reviewed and reconciled with the patient  .  PAST MEDICAL HISTORY  .  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  ALLERGIES  .  Sulfa  seasonal allergies  .  SURGICAL HISTORY  .  C/S 04/2005  C/S 12/2009  .  FAMILY HISTORY  .  Mother: alive, diabetes, hyperlipidemia, hypertension  Father: unknown  Paternal Grand Mother:  deceased  Paternal Grand Father: deceased, colon CA, Breast CA, diagnosed  with Other malignant neoplasm of unspecified site  1 brother(s) , 1 sister(s) . 1 son(s) , 1 daughter(s) - healthy.  mother-htn,dmfather-htn,drug alcoholpgf-breast cancerNo Colon CA  sister and one paternal cousin: kidney disease (heriditary, only  one kidney)denied pre-mature CAD in family.  .  SOCIAL HISTORY  .  .  Tobaccohistory:Never smoked.  .  Marital Status Married.  .  Caffeine: drink coffee.  Marland Kitchen  Sexual history Currently active with your husband only, no Hx of  STD.  .  Work/Occupation: dentist office work.  .  Exercise: gym workouts,spinning- 3-4x/week.  .  Alcohol Denies.  .  Illicit drugs: Denies.  .  Condom use: Never.  .  Driver's License/Permit: yes, drives regularly.  .  Seat Belts: always.  .  Lives with: Husband and children.  .  lives with husband and kids (5 and 8 YO feel safe at home.  .  GYN HISTORY  .  H/O Hx Abnormal pap smear 2000-colposcopy & repeat pap nl  Birth control 12/2017 Kyleena  GPAL:  G2P2  Last Pap Smear 04/2016- negative/hpv negative  Last Mammogram [facility]- had bx- has 6mos f/u 06/2019  .  HOSPITALIZATION/MAJOR DIAGNOSTIC PROCEDURE  .  C/S 04/2005  C/S 12/2009  .  REVIEW OF SYSTEMS  .  [facility] ROS:  .  General:    no  fever, no weakness, no memory loss, no swollen  glands, no bruising, no weight loss, no constant fatigue, no  physical abuse, no emotional abuse . Skin:    no changing moles,  no rash, no jaundice . HEENT:    no visual problems, no eye pain,  no ear pain, no difficulty hearing, no headaches, no sinus  trouble . Respiratory:    no shortness of breath, no cough , no  wheezing . Breasts:    no breast lumps, no breast pain, no nipple  discharge . Cardiovascular:    no chest pains, no palpitations,  no swelling, no dizziness, no murmurs . Gastrointestinal:    no  nausea , no vomiting, no loss of appetite, no constipation, no  diarrhea . Female Genitourinary:    NO , Dyspareunia,  Dysuria,  Abnormal vaginal discharge, Menstrual irregularities .  Musculoskeletal:    no joint pain, no joint swelling, no joint  stiffness . Neurological:    __ . Psychiatric:    no anxiety, no  depression .  Marland Kitchen  Patient intake reviewed with patient and scanned into system.  Marland Kitchen  VITAL SIGNS  .  Ht-in 5'8, Wt-lbs 221, BMI 43.16, BP 120/82, BSA 2.06, Wt-kg  100.24, Wt Change 8 lb, LMP: 02/03/19.  Marland Kitchen  PHYSICAL EXAMINATION  .  [facility] Exam:  General:  General appearance: well-appearing, no acute distress,  Mental status: alert and oriented, Mood/affect: pleasant. Neck:   Thyroid: unremarkable, Neck mass: none. Breasts:  General: no  masses, tenderness, skin changes, or nipple abnormality, Skin:  unremarkable, Axilla: no palpable masses. Abdomen:  General:  soft, Tenderness: nontender, Masses: none, Hernia: absent,  Distention: none. Skin:  General: warm, moist, no rash.  Genitourinary:  External Genitalia: normal, Vagina: normal,  pink/moist mucosa, no lesions, no abnormal discharge, Urethral  Meatus: normal, Urethra: normal, Bladder: normal, Cervix: normal,  + IUD string visualized, Uterus: normal, Adnexa/Parametria:  normal, Anus/Perinuem: normal, Groin nodes: normal. Pap done.  .  ASSESSMENTS  .  Encounter for gynecological examination - Z01.419 (Primary)  .  TREATMENT  .  Encounter for gynecological examination  LAB: Cytology/Gynecological  .  LAB: HPV DNA  .  PREVENTIVE MEDICINE  .  Patient instructed to call office within 2-3 weeks if she does  not receive results from today's visit. Explained results letter  sent with all tests performed in office. Patient agrees to call  if she does not receive letter.  .  FOLLOW UP  .  1 Year  .  Marland Kitchen  Appointment Provider: Fredrik Cove, MD  .  Electronically signed by Dennison Bulla MD on  02/04/2019 at 10:10 AM EST  .  Document electronically signed by Fredrik Cove MD  .

## 2019-02-04 NOTE — Progress Notes (Signed)
 .  Progress Notes  .  Patient: Brandy [name], Brandy [name]  Provider: Fredrik Cove  DOB: 1975/04/24 Age: 43 Y Sex: Female  .  PCP: Bernette Mayers  MD  Date: 02/04/2019  .  --------------------------------------------------------------------------------  .  REASON FOR APPOINTMENT  .  1. LAST 08/2017  .  HISTORY OF PRESENT ILLNESS  .  GENERAL:   43 yo G2P2 married +SAKyleena placed 12/25/17 very light spotting  mammogram @ [facility]- dense breasts, cysts, had bx & f/u scheduled  in 6 mos.  .  CURRENT MEDICATIONS  .  Taking Benadryl  Taking Flonase 50 MCG/ACT Suspension 1 spray in each nostril  Nasally Once a day  Taking Kyleena  Taking Sudafed , Notes: prn  Taking Zyrtec  Not-Taking/PRN Apri 0.15-30 MG-MCG Tablet 1 tablet Orally Once a  day  Not-Taking/PRN Apri 0.15-30 MG-MCG Tablet 1 tablet Orally Once a  day  Not-Taking/PRN Apri 0.15-30 MG-MCG Tablet 1 tablet Orally Once a  day  Not-Taking/PRN Apri 0.15-30 MG-MCG Tablet 1 tablet Orally Once a  day  Not-Taking/PRN Augmentin  Not-Taking/PRN Ibuprofen 400 mg Tablet 1 tablet Orally as needed  Not-Taking/PRN Kariva 0.15-0.02/0.01 MG (21/5) Tablet 1 tablet  Orally Once a day  Not-Taking/PRN Kariva 0.15-0.02/0.01 MG (21/5) Tablet 1 tablet  Orally Once a day  Not-Taking/PRN Kyleena  Not-Taking/PRN Kyleena  Not-Taking/PRN Meclizine HCl 25 mg Tablet 1 tablet as needed  Orally Once a day  Not-Taking/PRN Mirena  Not-Taking/PRN Misoprostol 200 MCG Tablet as directed  intravaginally 1 tab placed into vaginal night prior to  procedure, 1 tab buccual 2 hours prior to procedure  Unknown Zyrtec 1 tab Oral  Medication List reviewed and reconciled with the patient  .  PAST MEDICAL HISTORY  .  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  ALLERGIES  .  Sulfa  seasonal allergies  .  SURGICAL HISTORY  .  C/S 04/2005  C/S 12/2009  .  FAMILY HISTORY  .  Mother: alive, diabetes, hyperlipidemia, hypertension  Father: unknown  Paternal Grand Mother:  deceased  Paternal Grand Father: deceased, colon CA, Breast CA, diagnosed  with Other malignant neoplasm of unspecified site  1 brother(s) , 1 sister(s) . 1 son(s) , 1 daughter(s) - healthy.  mother-htn,dmfather-htn,drug alcoholpgf-breast cancerNo Colon CA  sister and one paternal cousin: kidney disease (heriditary, only  one kidney)denied pre-mature CAD in family.  .  SOCIAL HISTORY  .  .  Tobaccohistory:Never smoked.  .  Marital Status Married.  .  Caffeine: drink coffee.  Marland Kitchen  Sexual history Currently active with your husband only, no Hx of  STD.  .  Work/Occupation: dentist office work.  .  Exercise: gym workouts,spinning- 3-4x/week.  .  Alcohol Denies.  .  Illicit drugs: Denies.  .  Condom use: Never.  .  Driver's License/Permit: yes, drives regularly.  .  Seat Belts: always.  .  Lives with: Husband and children.  .  lives with husband and kids (5 and 8 YO feel safe at home.  .  GYN HISTORY  .  H/O Hx Abnormal pap smear 2000-colposcopy & repeat pap nl  Birth control 12/2017 Kyleena  GPAL:  G2P2  Last Pap Smear 04/2016- negative/hpv negative  Last Mammogram [facility]- had bx- has 6mos f/u 06/2019  .  HOSPITALIZATION/MAJOR DIAGNOSTIC PROCEDURE  .  C/S 04/2005  C/S 12/2009  .  REVIEW OF SYSTEMS  .  [facility] ROS:  .  General:  no fever, no weakness, no memory loss, no swollen  glands, no bruising, no weight loss, no constant fatigue, no  physical abuse, no emotional abuse . Skin:    no changing moles,  no rash, no jaundice . HEENT:    no visual problems, no eye pain,  no ear pain, no difficulty hearing, no headaches, no sinus  trouble . Respiratory:    no shortness of breath, no cough , no  wheezing . Breasts:    no breast lumps, no breast pain, no nipple  discharge . Cardiovascular:    no chest pains, no palpitations,  no swelling, no dizziness, no murmurs . Gastrointestinal:    no  nausea , no vomiting, no loss of appetite, no constipation, no  diarrhea . Female Genitourinary:    NO , Dyspareunia,  Dysuria,  Abnormal vaginal discharge, Menstrual irregularities .  Musculoskeletal:    no joint pain, no joint swelling, no joint  stiffness . Neurological:    __ . Psychiatric:    no anxiety, no  depression .  Marland Kitchen  Patient intake reviewed with patient and scanned into system.  Marland Kitchen  VITAL SIGNS  .  Ht-in 5'8, Wt-lbs 221, BMI 43.16, BP 120/82, BSA 2.06, Wt-kg  100.24, Wt Change 8 lb, LMP: 02/03/19.  Marland Kitchen  PHYSICAL EXAMINATION  .  [facility] Exam:  General:  General appearance: well-appearing, no acute distress,  Mental status: alert and oriented, Mood/affect: pleasant. Neck:   Thyroid: unremarkable, Neck mass: none. Breasts:  General: no  masses, tenderness, skin changes, or nipple abnormality, Skin:  unremarkable, Axilla: no palpable masses. Abdomen:  General:  soft, Tenderness: nontender, Masses: none, Hernia: absent,  Distention: none. Skin:  General: warm, moist, no rash.  Genitourinary:  External Genitalia: normal, Vagina: normal,  pink/moist mucosa, no lesions, no abnormal discharge, Urethral  Meatus: normal, Urethra: normal, Bladder: normal, Cervix: normal,  + IUD string visualized, Uterus: normal, Adnexa/Parametria:  normal, Anus/Perinuem: normal, Groin nodes: normal. Pap done.  .  ASSESSMENTS  .  Encounter for gynecological examination - Z01.419 (Primary)  .  TREATMENT  .  Encounter for gynecological examination  LAB: Cytology/Gynecological  pap smear negative/hpv negative    Barron,Jennifer , RN 02/09/2019 8:51:09 AM > Fredrik Cove, MD  02/12/2019 10:27:10 PM >  .  .  LAB: HPV DNA  .  PREVENTIVE MEDICINE  .  Patient instructed to call office within 2-3 weeks if she does  not receive results from today's visit. Explained results letter  sent with all tests performed in office. Patient agrees to call  if she does not receive letter.  .  FOLLOW UP  .  1 Year  .  Marland Kitchen  Appointment Provider: Fredrik Cove, MD  .  Electronically signed by Dennison Bulla , MD on  07/08/2019 at 10:43 AM EDT  .  Document  electronically signed by Fredrik Cove   .

## 2019-02-09 LAB — HX CYTO GYN & NON GYN

## 2019-06-03 ENCOUNTER — Ambulatory Visit: Admitting: Obstetrics & Gynecology

## 2019-06-03 NOTE — Progress Notes (Signed)
* * *      Brandy [name], Brandy [name] **DOB:** 1975-07-14 [phone] F) **Acc No.** [id number] **DOS:**  06/03/2019    ---       [name], [name]**    ------    66 Y old Female, DOB: 09-28-1975    [address] [place], [place] [address]    Home: [phone]    Provider: Fredrik Cove        * * *    Telephone Encounter    ---    Answered by  Cathleen Corti Date: 06/03/2019       Time: 03:05 PM    Reason  Bleeding and spotting    ------            Message                     Patient called [name] has been bleeding and spotting for a month                 Action Taken                     Fergus,Simone  06/03/2019 3:06:36 PM >      [name] , RN 06/03/2019 3:14:20 PM > pt had Kyleena 12/25/2017, pt states she's been having bleeding and spotting for a month. pt due for annual 01/2019, to front for annual and problem visit                    * * *                ---          * * *         Provider: Fredrik Cove 06/03/2019    ---    Note generated by eClinicalWorks EMR/PM Software ([email or web address])

## 2019-07-08 ENCOUNTER — Ambulatory Visit: Admit: 2019-07-08 | Payer: HMO

## 2019-07-08 ENCOUNTER — Ambulatory Visit (HOSPITAL_BASED_OUTPATIENT_CLINIC_OR_DEPARTMENT_OTHER): Admitting: Psychiatry

## 2019-07-08 ENCOUNTER — Ambulatory Visit: Admitting: Obstetrics & Gynecology

## 2019-07-08 MED ORDER — Kariva: 90 | 3 refills | 0 days

## 2019-07-08 NOTE — Progress Notes (Signed)
 Brandy [name], Valeda **DOB:** 10-14-1975 (44 yo F) **Acc No.** [id number] **DOS:**  07/08/2019    ---       Brandy [name], Brandy [name]**    ------    61 Y old Female, DOB: 1975/11/05, External MRN: [id number]    Account Number: [id number]    [address] [place], [place]    Home: [phone]    Insurance: HMO [place] OUT IPA Payer ID: PAPER    PCP: Bernette Mayers, MD Referring: Bernette Mayers, MD External Visit ID:  [id number]    Appointment Facility: [facility]        * * *    07/08/2019  **Appointment Provider:** Fredrik Cove, MD **CHN#:** [phone]    ------    ---       **Current Medications**    ---    Taking    * Benadryl     ---    * Flonase 50 MCG/ACT Suspension 1 spray in each nostril Nasally Once a day    ---    Rutha Bouchard     ---    * Sudafed , Notes: prn    ---    * Zyrtec     ---    Not-Taking/PRN    * Apri 0.15-30 MG-MCG Tablet 1 tablet Orally Once a day    ---    * Apri 0.15-30 MG-MCG Tablet 1 tablet Orally Once a day    ---    * Apri 0.15-30 MG-MCG Tablet 1 tablet Orally Once a day    ---    * Apri 0.15-30 MG-MCG Tablet 1 tablet Orally Once a day    ---    * Augmentin     ---    * Ibuprofen 400 mg Tablet 1 tablet Orally as needed    ---    * Kariva 0.15-0.02/0.01 MG (21/5) Tablet 1 tablet Orally Once a day    ---    * Kariva 0.15-0.02/0.01 MG (21/5) Tablet 1 tablet Orally Once a day    ---    Rutha Bouchard     ---    Rutha Bouchard     ---    * Meclizine HCl 25 mg Tablet 1 tablet as needed Orally Once a day    ---    * Mirena     ---    * Misoprostol 200 MCG Tablet as directed intravaginally 1 tab placed into vaginal night prior to procedure, 1 tab buccual 2 hours prior to procedure    ---    Unknown    * Zyrtec 1 tab Oral     ---     Past Medical History    ---      Back pain/scoliosis.        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        ---    Sinusitis.        ---    Sarcoidosis- Dx 12/2018.        ---      **Surgical History**    ---      C/S 04/2005    ---     C/S 12/2009    ---    thoracic LN Bx 04/2019    ---      **Family History**    ---      Mother: alive, diabetes, hyperlipidemia, hypertension    ---    Father: unknown    ---  Paternal Grand Mother: deceased    ---    Paternal Grand Father: deceased, colon CA, Breast CA, diagnosed with Other  malignant neoplasm of unspecified site    ---    1 brother(s) , 1 sister(s) . 1 son(s) , 1 daughter(s) - healthy.    ---    mother-htn,dm    father-htn,drug alcohol    pgf-breast cancer    No Colon CA    sister and one paternal cousin: kidney disease (heriditary, only one kidney)    denied pre-mature CAD in family.    ---      **Social History**    ---    Tobacco history: Never smoked.    Marital Status  Married.    Caffeine: drink coffee.    Sexual history  Currently active with your husband only, no Hx of STD.    Work/Occupation: Education officer, community office work.    Exercise: gym workouts,spinning- 3-4x/week.    Alcohol  Denies.    Illicit drugs: Denies.    Condom use: Never.    Driver's License/Permit: yes, drives regularly.    Seat Belts: always.    Lives with: Husband and children.  lives with husband and kids (5 and 8 YO  feel safe at home.    ---      **Gyn History**    ---    H/O Hx Abnormal pap smear 2000-colposcopy & repeat pap nl.    Birth control 12/2017 Kyleena.    GPAL: G2P2.    Last Pap Smear 01/2019- negative/hpv negative.    Last Mammogram Atrius- had bx- has 6mos f/u 06/2019.     **Allergies**    ---      Sulfa    ---    seasonal allergies    ---    [Allergies Verified]      **Hospitalization/Major Diagnostic Procedure**    ---      C/S 04/2005    ---    C/S 12/2009    ---      **Review of Systems**    ---     _[facility]_ :    General: no fever, no weakness, no memory loss, no swollen glands, no  bruising, no weight loss, no constant fatigue, no physical abuse, no emotional  abuse. Skin: no changing moles, no rash, no jaundice. HEENT: no visual  problems, no eye pain, no ear pain, no difficulty  hearing, no headaches, no  sinus trouble. Respiratory: no shortness of breath, no cough , no wheezing.  Breasts: no breast lumps, no breast pain, no nipple discharge. Cardiovascular:  no chest pains, no palpitations, no swelling, no dizziness, no murmurs.  Gastrointestinal: no nausea , no vomiting, no loss of appetite, no  constipation, no diarrhea. Female Genitourinary: +, Menstrual irregularities.  Musculoskeletal: no joint pain, no joint swelling, no joint stiffness.  Neurological: __. Psychiatric: no anxiety, no depression.    Patient intake reviewed with patient and scanned into system.       **Reason for Appointment**    ---      1\. IUD CHECK    ---      **History of Present Illness**    ---     _GENERAL_ :    44 yo G2P2    She had Kyleena placed 12/2017- She has had a lot of spotting, wants IUD  removed. She wants to restart ocp.      **Vital Signs**    ---    Ht-in 5'8, Wt-lbs 225, BMI 43.94,  BP 122/76, BSA 2.08, Wt-kg 102.06, Wt Change  4 lb, LMP: iud.      **Physical Examination**    ---     _Colposcopy_ :    Indication of procedure: ____________________.    Christian Mate General_ :    General Appearance: well-appearing, no acute distress. Mental Status: alert  and oriented. Mood/Affect: pleasant.    _OB/Gyn Abdomen_ :    General: soft. Tenderness: nontender. Masses: none.    _OB/Gyn Genitourinary_ :    External Genitalia: normal. Vagina: normal, pink/moist mucosa, no lesions, no  abnormal discharge. Cervix: normal appearance, no lesions/discharge/bleeding,  no cervical motion tenderness. Urethra Normal. Bladder Normal .         **Assessments**    ---    1\. Contraception management - Z30.9 (Primary)    ---    2\. Encounter for IUD removal - Z30.432    ---      **Treatment**    ---      **1\. Contraception management**    Start Kariva Tablet, 0.15-0.02/0.01 MG (21/5), as directed, Orally, 1 tablet a  day, 90 days, 90, Refills 3    ---      **Procedures**    ---     _IUD Removal_ :    Consent Informed  consent obtained. Procedure A speculum was placed in the  vagina, the cervix was visualized, IUD strings identified , Kyleena IUD  removed intact without difficulty .         **Preventive Medicine**    ---      Patient instructed to call office within 2-3 weeks if she does not receive  results from today's visit. Explained results letter sent with all tests  performed in office. Patient agrees to call if she does not receive letter.        ---      **Procedure Codes**    ---      [phone] IUD Removal    ---      **Follow Up**    ---    1 Year    **Appointment Provider:** Fredrik Cove, MD    Electronically signed by Dennison Bulla , MD on 07/08/2019 at 11:07 AM EDT    Sign off status: Completed        * * [facility]    [address] [place], [place] [address]    Tel: [phone]    Fax: [phone]              * * *          Progress Note: Fredrik Cove, MD 07/08/2019    ---    Note generated by eClinicalWorks EMR/PM Software ([email or web address])

## 2019-07-08 NOTE — Progress Notes (Signed)
 .  Progress Notes  .  Patient: Brandy [name], Brandy [name]  Provider: Fredrik Cove  DOB: 24-Sep-1975 Age: 44 Y Sex: Female  .  PCP: Bernette Mayers  MD  Date: 07/08/2019  .  --------------------------------------------------------------------------------  .  REASON FOR APPOINTMENT  .  1. IUD CHECK  .  HISTORY OF PRESENT ILLNESS  .  GENERAL:   44 yo G2P2She had Kyleena placed 12/2017- She has had a lot of  spotting, wants IUD removed. She wants to restart ocp.  .  CURRENT MEDICATIONS  .  Taking Benadryl  Taking Flonase 50 MCG/ACT Suspension 1 spray in each nostril  Nasally Once a day  Taking Kyleena  Taking Sudafed , Notes: prn  Taking Zyrtec  Not-Taking/PRN Apri 0.15-30 MG-MCG Tablet 1 tablet Orally Once a  day  Not-Taking/PRN Apri 0.15-30 MG-MCG Tablet 1 tablet Orally Once a  day  Not-Taking/PRN Apri 0.15-30 MG-MCG Tablet 1 tablet Orally Once a  day  Not-Taking/PRN Apri 0.15-30 MG-MCG Tablet 1 tablet Orally Once a  day  Not-Taking/PRN Augmentin  Not-Taking/PRN Ibuprofen 400 mg Tablet 1 tablet Orally as needed  Not-Taking/PRN Kariva 0.15-0.02/0.01 MG (21/5) Tablet 1 tablet  Orally Once a day  Not-Taking/PRN Kariva 0.15-0.02/0.01 MG (21/5) Tablet 1 tablet  Orally Once a day  Not-Taking/PRN Kyleena  Not-Taking/PRN Kyleena  Not-Taking/PRN Meclizine HCl 25 mg Tablet 1 tablet as needed  Orally Once a day  Not-Taking/PRN Mirena  Not-Taking/PRN Misoprostol 200 MCG Tablet as directed  intravaginally 1 tab placed into vaginal night prior to  procedure, 1 tab buccual 2 hours prior to procedure  Unknown Zyrtec 1 tab Oral  .  PAST MEDICAL HISTORY  .  Back pain/scoliosis  Osteoarthritis knee pain on Ibuprofen as needed  Sinusitis  Sarcoidosis- Dx 12/2018  .  ALLERGIES  .  Sulfa  seasonal allergies  .  SURGICAL HISTORY  .  C/S 04/2005  C/S 12/2009  thoracic LN Bx 04/2019  .  FAMILY HISTORY  .  Mother: alive, diabetes, hyperlipidemia, hypertension  Father: unknown  Paternal Grand Mother: deceased  Paternal Grand Father: deceased,  colon CA, Breast CA, diagnosed  with Other malignant neoplasm of unspecified site  1 brother(s) , 1 sister(s) . 1 son(s) , 1 daughter(s) - healthy.  mother-htn,dmfather-htn,drug alcoholpgf-breast cancerNo Colon CA  sister and one paternal cousin: kidney disease (heriditary, only  one kidney)denied pre-mature CAD in family.  .  SOCIAL HISTORY  .  .  Tobaccohistory:Never smoked.  .  Marital Status Married.  .  Caffeine: drink coffee.  Marland Kitchen  Sexual history Currently active with your husband only, no Hx of  STD.  .  Work/Occupation: dentist office work.  .  Exercise: gym workouts,spinning- 3-4x/week.  .  Alcohol Denies.  .  Illicit drugs: Denies.  .  Condom use: Never.  .  Driver's License/Permit: yes, drives regularly.  .  Seat Belts: always.  .  Lives with: Husband and children.  .  lives with husband and kids (5 and 8 YO feel safe at home.  .  GYN HISTORY  .  H/O Hx Abnormal pap smear 2000-colposcopy & repeat pap nl  Birth control 12/2017 Kyleena  GPAL:  G2P2  Last Pap Smear 01/2019- negative/hpv negative  Last Mammogram Atrius- had bx- has 6mos f/u 06/2019  .  HOSPITALIZATION/MAJOR DIAGNOSTIC PROCEDURE  .  C/S 04/2005  C/S 12/2009  .  REVIEW OF SYSTEMS  .  [facility] ROS:  .  General:    no fever,  no weakness, no memory loss, no swollen  glands, no bruising, no weight loss, no constant fatigue, no  physical abuse, no emotional abuse . Skin:    no changing moles,  no rash, no jaundice . HEENT:    no visual problems, no eye pain,  no ear pain, no difficulty hearing, no headaches, no sinus  trouble . Respiratory:    no shortness of breath, no cough , no  wheezing . Breasts:    no breast lumps, no breast pain, no nipple  discharge . Cardiovascular:    no chest pains, no palpitations,  no swelling, no dizziness, no murmurs . Gastrointestinal:    no  nausea , no vomiting, no loss of appetite, no constipation, no  diarrhea . Female Genitourinary:    +, Menstrual irregularities .  Musculoskeletal:    no joint pain, no  joint swelling, no joint  stiffness . Neurological:    __ . Psychiatric:    no anxiety, no  depression .  Marland Kitchen  Patient intake reviewed with patient and scanned into system.  Marland Kitchen  VITAL SIGNS  .  Ht-in 5'8, Wt-lbs 225, BMI 43.94, BP 122/76, BSA 2.08, Wt-kg  102.06, Wt Change 4 lb, LMP: iud.  .  PHYSICAL EXAMINATION  .  Colposcopy:  Indication of procedure:  ____________________.  OB/Gyn General:  General Appearance:  well-appearing, no acute distress. Mental  Status:  alert and oriented. Mood/Affect:  pleasant.  OB/Gyn Abdomen:  General:  soft. Tenderness:  nontender. Masses:  none.  OB/Gyn Genitourinary:  External Genitalia:  normal. Vagina:  normal, pink/moist mucosa,  no lesions, no abnormal discharge. Cervix:  normal appearance, no  lesions/discharge/bleeding, no cervical motion tenderness.  Urethra   Normal. Bladder  Normal .  .  ASSESSMENTS  .  Contraception management - Z30.9 (Primary)  .  Encounter for IUD removal - Z30.432  .  TREATMENT  .  Contraception management  Start Kariva Tablet, 0.15-0.02/0.01 MG (21/5), as directed,  Orally, 1 tablet a day, 90 days, 90, Refills 3  .  PROCEDURES  .  IUD Removal  Consent    Informed consent obtained  Procedure    A speculum was placed in the vagina, the cervix was  visualized, IUD strings identified , Kyleena IUD removed intact  without difficulty  .  PREVENTIVE MEDICINE  .  Patient instructed to call office within 2-3 weeks if she does  not receive results from today's visit. Explained results letter  sent with all tests performed in office. Patient agrees to call  if she does not receive letter.  Marland Kitchen  PROCEDURE CODES  .  62952 IUD Removal  .  FOLLOW UP  .  1 Year  .  Marland Kitchen  Appointment Provider: Fredrik Cove, MD  .  Electronically signed by Dennison Bulla , MD on  07/08/2019 at 11:07 AM EDT  .  Document electronically signed by Fredrik Cove   .

## 2020-05-09 ENCOUNTER — Institutional Professional Consult (permissible substitution): Payer: BC Managed Care – PPO | Admitting: Internal Medicine

## 2020-06-18 NOTE — Progress Notes (Signed)
* * *        **  Brandy [name]**    --- ---    31 Y old Female, DOB: [birth year]    [address], [place], [place] [address]    Home: [phone]    Provider: Bernette Mayers, MD        * * *    Telephone Encounter    ---    Answered by   Mingo Amber  Date: 07/24/2015         Time: 04:10 PM    Reason   need PE    --- ---            Message                      last PE with Dr.Blessing Ozga was 01/10/2014.                Action Taken   Yu,Yuci 07/26/2015 3:04:07 PM > called pt, no respond, letter  sent.                * * *         **eMessages**   From:   Yu,Yuci    --- ---    Created:   2015-07-26 15:04:04    Sent:      Subject:   UY:QIHK PE    Message:      Genia Plants, M.D.    Date: 07/26/2015    [facility]    03-Jan-1976    [address]    [place] [address]    ???    Dear Brandy [name],    As your primary care physician, my goal is to continue to provide you with the  highest quality, safe, effective healthcare available and keep you as healthy  as possible. Not only is it important that I provide the best care possible  when you may become ill, but preventive health maintenance is a vital part of  your overall healthcare as well. A review or your medical record indicates  that you are due for a:    **    Routine Physical    **    Please contact our office at [phone] at your earliest convenience to  schedule an appointment.    Sincerely,    Office of [facility]    Quality Team                        ---          * * *          Patient: [name], GROUNDS DOB: Jul 30, 1975 Provider: Bernette Mayers, MD  07/24/2015    ---    Note generated by eClinicalWorks EMR/PM Software ([email or web address])

## 2020-06-19 ENCOUNTER — Other Ambulatory Visit: Payer: Self-pay

## 2020-06-19 ENCOUNTER — Encounter: Payer: Self-pay | Admitting: Internal Medicine

## 2020-06-19 ENCOUNTER — Ambulatory Visit (INDEPENDENT_AMBULATORY_CARE_PROVIDER_SITE_OTHER): Payer: BC Managed Care – PPO | Admitting: Internal Medicine

## 2020-06-19 VITALS — BP 118/78 | HR 85 | Temp 97.6°F | Ht 68.0 in | Wt 189.0 lb

## 2020-06-19 DIAGNOSIS — Z862 Personal history of diseases of the blood and blood-forming organs and certain disorders involving the immune mechanism: Secondary | ICD-10-CM

## 2020-06-19 DIAGNOSIS — Z8616 Personal history of COVID-19: Secondary | ICD-10-CM

## 2020-06-19 NOTE — Progress Notes (Signed)
* * *        Brandy [name]**    --- ---    45 Y old Female, DOB: June 15, 1975, External MRN: [id number]    Account Number: [id number]    [address] [place], [place]    Home: [phone]    Insurance: HMO [place] OUT IPA Payer ID: PAPER    PCP: Darcus Pester Referring: Darcus Pester External Visit ID: [id number]    Appointment Facility: [facility]        * * *    04/29/2016   **Appointment Provider:** Fredrik Cove, MD **CHN#:** [phone]    --- ---    ---        Current Medications    ---    Taking     * Apri 0.15-30 MG-MCG Tablet 1 tablet Orally Once a day    ---    * Ibuprofen 400 mg Tablet 1 tablet Orally as needed    ---    * Zyrtec 1 tab Oral     ---    Not-Taking/PRN    * Apri 0.15-30 MG-MCG Tablet 1 tablet Orally Once a day    ---    * Apri 0.15-30 MG-MCG Tablet 1 tablet Orally Once a day    ---    * Apri 0.15-30 MG-MCG Tablet 1 tablet Orally Once a day    ---    * Flonase 50 MCG/ACT Suspension 1 spray in each nostril Nasally Once a day    ---    * Meclizine HCl 25 mg Tablet 1 tablet as needed Orally Once a day    ---    * Medication List reviewed and reconciled with the patient    ---      Past Medical History    ---       Back pain/scoliosis.        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        ---      Surgical History    ---      C/S 04/2005    ---    C/S 12/2009    ---      Family History    ---      Mother: alive, diabetes, hyperlipidemia, hypertension    ---    Father: unknown    ---    Paternal Grand Mother: deceased    ---    Paternal Grand Father: deceased, colon CA, Breast CA, diagnosed with Cancer    ---    1 brother(s) , 1 sister(s) . 1 son(s) , 1 daughter(s) - healthy.    ---    mother-htn,dm    father-htn,drug alcohol    gf-breast cancer/colon    sister and one paternal cousin: kidney disease (heriditary, only one kidney)    denied pre-mature CAD in family.    ---      Social History    ---    Tobacco history: Never smoked.    Marital Status  Married.     Caffeine: drink coffee.    Sexual history  Currently active with your husband only, no Hx of STD.    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    Exercise: gym workouts,spinning- 3-4x/week.    Alcohol  Denies.    Illicit drugs: Denies.    Condom use: Never.    Driver's License/Permit: yes, drives regularly.    Seat Belts: always.  Lives with: Husband and children.   lives with husband and kids (5 and 8 YO  feel safe at home.    ---      Gyn History    ---    H/O Hx Abnormal pap smear 2000-colposcopy & repeat pap nl.    Birth control ocp.    GPAL: G2P2.    Last Pap Smear 03/29/14 negative/hpv negative.      Allergies    ---      Sulfa    ---    seasonal allergies    ---    [Allergies Verified]      Hospitalization/Major Diagnostic Procedure    ---      C/S 04/2005    ---    C/S 12/2009    ---      Review of Systems    ---     _[facility] ROS_ :    General: no fever, no weakness, no memory loss, no swollen glands, no  bruising, no weight loss, no constant fatigue, no physical abuse, no emotional  abuse. Skin: no changing moles, no rash, no jaundice. HEENT: no visual  problems, no eye pain, no ear pain, no difficulty hearing, no headaches, no  sinus trouble. Respiratory: no shortness of breath, no cough , no wheezing.  Breasts: no breast lumps, no breast pain, no nipple discharge. Cardiovascular:  no chest pains, no palpitations, no swelling, no dizziness, no murmurs.  Gastrointestinal: no nausea , no vomiting, no loss of appetite, no  constipation, no diarrhea. Female Genitourinary: __. Musculoskeletal: no joint  pain, no joint swelling, no joint stiffness. Neurological: __. Psychiatric: no  anxiety, no depression.    Patient intake reviewed with patient and scanned into system.        Reason for Appointment    ---      1\. Annual    ---      History of Present Illness    ---     _GENERAL_ :    45 yo married, + SA    bc- ocp    menses light.      Vital Signs    ---    Pain scale 0,  Ht-in 5'8, Wt-lbs 206, BMI 40.23, BP 122/80.      Physical Examination    ---     _[facility] Exam_ :    General: General appearance: well-appearing, no acute distress, Mental status:  alert and oriented, Mood/affect: pleasant. Neck: Thyroid: unremarkable, Neck  mass: none. Chest/Thorax: Breath sounds: clear to auscultation. Heart: Rythm:  regular, Murmurs: none, Rate: normal. Breasts: General: no masses, tenderness,  skin changes, or nipple abnormality, Skin: unremarkable, Axilla: no palpable  masses. Abdomen: General: soft, Tenderness: nontender, Masses: none, Hernia:  absent, Distention: none. Skin: General: warm, moist, no rash. Genitourinary:  External Genitalia: normal, Vagina: normal, pink/moist mucosa, no lesions, no  abnormal discharge, Urethral Meatus: normal, Urethra: normal, Bladder: normal,  Cervix: normal, Uterus: normal, Adnexa/Parametria: normal, Anus/Perinuem:  normal, Groin nodes: normal. Pap done.          Assessments    ---    1\. Encounter for gynecological examination - Z01.419 (Primary)    ---    2\. Oral contraceptive pill surveillance - Z30.41    ---      Treatment    ---       **1\. Encounter for gynecological examination**    Refill Apri Tablet, 0.15-30 MG-MCG, 1 tablet, Orally, Once a day, 90 days, 90,  Refills 3    _LAB: Cytology/Gynecological_    _LAB: HPV DNA_    _IMAGING: Mammogram - BILAT Digital_    ---       Preventive Medicine    ---      Patient instructed to call office within 2-3 weeks if she does not receive  results from today's visit. Explained results letter sent with all tests  performed in office. Patient agrees to call if she does not receive letter.        ---      Follow Up    ---    1 Year    **Appointment Provider:** Fredrik Cove, MD    Electronically signed by Dennison Bulla MD on 04/29/2016 at 11:23 AM EST    Sign off status: Completed        * * [facility]    [address] [place], [place] [address]    Tel: [phone]     Fax: [phone]              * * *          Patient: Brandy [name], Brandy [name] DOB: [birth year] Progress Note: Fredrik Cove,  MD 04/29/2016    ---    Note generated by eClinicalWorks EMR/PM Software ([email or web address])

## 2020-06-19 NOTE — Progress Notes (Signed)
* * *        **  Brandy [name]**    --- ---    3 Y old Female, DOB: 01-28-1976    [address] [place], [place] [address]    Home: [phone]    Provider: Fredrik Cove, MD        * * *    Telephone Encounter    ---    Answered by   Rodolph Bong  Date: 03/14/2016         Time: 02:25 PM    Reason   rx    --- ---            Action Taken   Bray,Paula 03/14/2016 2:25:35 PM > pt last seen 04/05/15 refill  sent apri with message needs annual            Refills  Start Apri Tablet, 0.15-30 MG-MCG, Orally, 84 Tablet, 1 tablet, Once  a day, 84 days, Refills=0    --- ---          * * *                ---          * * *          [name] DOB: 20-Apr-1975 Provider: Fredrik Cove, MD  03/14/2016    ---    Note generated by eClinicalWorks EMR/PM Software ([email or web address])

## 2020-06-19 NOTE — Progress Notes (Signed)
* * *        **  [name]**    --- ---    55 Y old Female, DOB: Aug 02, 1975    [address], [place], [place] [address]    Home: [phone]    Provider: Fredrik Cove, MD        * * *    Telephone Encounter    ---    Answered by   Etheleen Nicks  Date: 11/26/2017         Time: 09:48 AM    Caller   pt    --- ---            Reason   ? about changing ocp            Message                      pt has a question about changing her ocp.  [phone]                Action Taken                      Detar North  11/26/2017 9:48:41 AM >       Mikey [employer] 11/26/2017 9:51:07 AM > pt has had a Mirena in the past, she would like to stop oral ocp and have Mirena reinserted because it's causing her h/a's and she's been having more clots with her periods. Can I have her make an appt. for this? Thank you! Nila Nephew, MD 11/26/2017 11:48:17 AM > She can be scheduled when she has menses, needs Misoprostol as had C/S x 2      [name] , RN 11/26/2017 11:59:05 AM > rx sent, pt transferred to front to make appt.                Refills  Start Misoprostol Tablet, 200 MCG, intravaginally, 2, as directed, 1  tab placed into vaginal night prior to procedure, 1 tab buccual 2 hours prior  to procedure, 2 days, Refills=0    --- ---          * * *                ---          * * *          Patient: Brandy [name], Brandy [name] DOB: 07-27-1975 Provider: Fredrik Cove, MD  11/26/2017    ---    Note generated by eClinicalWorks EMR/PM Software ([email or web address])

## 2020-06-19 NOTE — Progress Notes (Signed)
* * *        [name]**    --- ---    45 Y old Female, DOB: [birth year], External MRN: [id number]    Account Number: [id number]    [address] [place], [place]    Home: [phone]    Insurance: HMO [place] [employer] Payer ID: PAPER    PCP: Bernette Mayers, MD Referring: Bernette Mayers, MD External Visit ID:  [id number]    Appointment Facility: [facility]        * * *    12/25/2017   **Appointment Provider:** Fredrik Cove, MD **CHN#:** [phone]    --- ---    ---         **Current Medications**    ---    Taking     * Augmentin     ---    * Benadryl     ---    * Kariva 0.15-0.02/0.01 MG (21/5) Tablet 1 tablet Orally Once a day    ---    * Misoprostol 200 MCG Tablet as directed intravaginally 1 tab placed into vaginal night prior to procedure, 1 tab buccual 2 hours prior to procedure    ---    * Sudafed , Notes: prn    ---    * Zyrtec 1 tab Oral     ---    Not-Taking/PRN    * Apri 0.15-30 MG-MCG Tablet 1 tablet Orally Once a day    ---    * Apri 0.15-30 MG-MCG Tablet 1 tablet Orally Once a day    ---    * Apri 0.15-30 MG-MCG Tablet 1 tablet Orally Once a day    ---    * Apri 0.15-30 MG-MCG Tablet 1 tablet Orally Once a day    ---    * Flonase 50 MCG/ACT Suspension 1 spray in each nostril Nasally Once a day    ---    * Ibuprofen 400 mg Tablet 1 tablet Orally as needed    ---    * Kariva 0.15-0.02/0.01 MG (21/5) Tablet 1 tablet Orally Once a day    ---    * Meclizine HCl 25 mg Tablet 1 tablet as needed Orally Once a day    ---      Past Medical History    ---       Back pain/scoliosis.        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        ---    Sinusitis.        ---       **Surgical History**    ---       C/S 04/2005    ---    C/S 12/2009    ---       **Family History**    ---       Mother: alive, diabetes, hyperlipidemia, hypertension    ---    Father: unknown    ---    Paternal Grand Mother: deceased    ---    Paternal Grand Father: deceased, colon CA, Breast CA, diagnosed  with Cancer    ---    1 brother(s) , 1 sister(s) . 1 son(s) , 1 daughter(s) - healthy.    ---    mother-htn,dm    father-htn,drug alcohol    pgf-breast cancer    No Colon CA    sister and one paternal cousin: kidney disease (heriditary, only one kidney)  denied pre-mature CAD in family.    ---       **Social History**    ---    Lives with: Husband and children.    Exercise: gym workouts,spinning- 3-4x/week.    Sexual history  Currently active with your husband only, no Hx of STD.    Condom use: Never.    Work/Occupation: Education officer, community office work.    Alcohol  Denies.    Illicit drugs: Denies.    Seat Belts: always.    Driver's License/Permit: yes, drives regularly.    Caffeine: drink coffee.    Marital Status  Married.    Tobacco history: Never smoked.   lives with husband and kids (5 and 8 YO feel  safe at home.    ---       **Allergies**    ---       Sulfa    ---    seasonal allergies    ---    [Allergies Verified]       **Hospitalization/Major Diagnostic Procedure**    ---       C/S 04/2005    ---    C/S 12/2009    ---         **Reason for Appointment**    ---       1\. IUD PLCMNT-MIRENA    ---       **History of Present Illness**    ---     _GENERAL_ :    LMP 10/3    cytotec this AM.       **Vital Signs**    ---    Ht-in 5'8, Wt-lbs 213, BMI 41.59, BP 120/82, BSA 2.02, Wt-kg 96.62, Wt Change  6 lb, LMP: 12/24/17    upt neg.Kem Boroughs.       **Physical Examination**    ---     _OB/Gyn IUD Insertion_ :    Type of IUD inserted: Mirena. Procedure: The patient was counseled on the risk  and benfits of the IUD. She read literature regarding the risks and given  opportunity to ask questions before insertion. She gave her verbal and written  consent prior to insertion., A speculum was placed into the vagina. The cervix  was swabbed with betadine. A single-toothed tenaculum was used to grasp the  anterior lip of the cervix., The Mirena IUD was unable to pass internal os-  Kyleena IUD was inserted as the product instructions. The  inserter was removed  from the uterus and the strings were visible protruding from the cervical os.  The tenaculum was removed from the cervix. The IUD strings were cut. The  speculum was removed. Post procedure: patient tolerated procedure well. Uterus  sounded to: 7 cm. The inserter was removed from the uterus and the strings  were visible protruding from the cervical os approximately: 4 cm.          **Assessments**    ---    1\. Encounter for IUD insertion - Z30.430 (Primary)    ---       **Treatment**    ---       **1\. Encounter for IUD insertion**    _LAB: Urine Pregnancy Test , HCG Qual (UPREG)_     negative    --- ---       **Procedure Codes**    ---       M5784 INSRT LEVONORGESTREL INTRAUTRN SYS    ---    69629 URINE PREGNANCY TEST, Units: 2.00    ---    B2841 Rutha Bouchard,  19.5 mg    ---       **Follow Up**    ---    6 Weeks    **Appointment Provider:** Fredrik Cove, MD    Electronically signed by Dennison Bulla MD on 12/25/2017 at 02:14 PM EDT    Sign off status: Completed        * * [facility]    [address] [place], [place] [address]    Tel: [phone]    Fax: [phone]              * * *          Patient: DONESHIA, HILL DOB: 16-Jul-1975 Progress Note: Fredrik Cove,  MD 12/25/2017    ---    Note generated by eClinicalWorks EMR/PM Software ([email or web address])

## 2020-06-19 NOTE — Progress Notes (Signed)
* * *        **  Brandy [name]**    --- ---    78 Y old Female, DOB: 06-09-1975    [address], [place], [place] [address]    Home: [phone]    Provider: Fredrik Cove, MD        * * *    Telephone Encounter    ---    Answered by   Teressa Senter  Date: 07/11/2016         Time: 03:24 PM    Reason   Switch OCP- check with Doristine Church    --- ---            Message                      Patient had annual with Dr. Alona Bene in February, has been getting menstrual headaches and would like to discuss changing OCP                Action Taken   [facility] 07/11/2016 3:24:29 PM > [phone] Puccio,Linda  , RN 07/11/2016 3:46:08 PM > pt has been on the same OCP since 2014 PAN,SUSANNA  , RN 07/14/2016 2:12:33 PM > l/m for pt to [facility] , RN 07/14/2016  2:35:16 PM > spoke to patient, pt report went to her PCP recently for  evaluation of headache, PCP did the full body woke up on her, everything is  fine. PCP believe headache might be d/t her OCP. Pt report she been  experiencing very bad headache while she is on her period. Pt denied any  dizziness or vision changes, however ibuprofen/Tylenol does not relieve the  headache. Advised pt if she is only getting headache while on sugar pills,  might not be d/t OCP, since sugar pills dose not contain hormones. Advised pt  I will check with dr. Alona Bene for her opinion. Maybe having pt try to take OCP  continuously? Parris,Julia 07/15/2016 4:33:10 PM > Discussed with KJ. Wants to  change her to Garnette Scheuermann due to placebos having a low dose of hormones still and  hopefully headaches will resolve. Patient is aware. No questions. Rx sent.            Refills  Start Kariva Tablet, 0.15-0.02/0.01 MG (21/5), Orally, 28, 1 tablet,  Once a day, 28 day(s), Refills=2    --- ---          * * *                ---          * * *          Patient: Brandy [name], Brandy [name] DOB: [birth year] Provider: Fredrik Cove, MD  07/11/2016    ---    Note generated by eClinicalWorks EMR/PM Software  ([email or web address])

## 2020-06-19 NOTE — Progress Notes (Signed)
* * *        **  Brandy [name]**    --- ---    69 Y old Female, DOB: 06/28/1975    [address] [place], [place] [address]    Home: [phone]    Provider: Fredrik Cove, MD        * * *    Telephone Encounter    ---    Answered by   Etheleen Nicks  Date: 07/07/2017         Time: 01:39 PM    Caller   pt    --- ---            Reason   ocp refill            Message                      annual sched on 08/07/17.  please send one more month of Kariva.                Action Taken                      [facility]  07/07/2017 1:40:07 PM >       Rubin,Danielle , RN 07/07/2017 1:44:24 PM > rx sent                Refills  Continue Kariva Tablet, 0.15-0.02/0.01 MG (21/5), Orally, 28 Tablet,  1 tablet, Once a day, 28 days, Refills=0    --- ---          * * *                ---          * * *          Patient: Brandy [name], Brandy [name] DOB: 1976/01/16 Provider: Fredrik Cove, MD  07/07/2017    ---    Note generated by eClinicalWorks EMR/PM Software ([email or web address])

## 2020-06-19 NOTE — Progress Notes (Signed)
* * *        **  Brandy [name]**    --- ---    31 Y old Female, DOB: [birth year]    [address], [place], [place] [address]    Home: [phone]    Provider: Bernette Mayers, MD        * * *    Telephone Encounter    ---    Answered by   Mingo Amber  Date: 07/24/2015         Time: 04:10 PM    Reason   need PE    --- ---            Message                      last PE with Dr.Sorina Derrig was 01/10/2014.                Action Taken   Yu,Yuci 07/26/2015 3:04:07 PM > called pt, no respond, letter  sent.                * * *         **eMessages**   From:   Yu,Yuci    --- ---    Created:   2015-07-26 15:04:04    Sent:      Subject:   UY:QIHK PE    Message:      Genia Plants, M.D.    Date: 07/26/2015    [facility]    03-Jan-1976    [address]    [place] [address]    ???    Dear Brandy [name],    As your primary care physician, my goal is to continue to provide you with the  highest quality, safe, effective healthcare available and keep you as healthy  as possible. Not only is it important that I provide the best care possible  when you may become ill, but preventive health maintenance is a vital part of  your overall healthcare as well. A review or your medical record indicates  that you are due for a:    **    Routine Physical    **    Please contact our office at [phone] at your earliest convenience to  schedule an appointment.    Sincerely,    Office of [facility]    Quality Team                        ---          * * *          Patient: Brandy [name], Brandy [name] DOB: Jul 30, 1975 Provider: Bernette Mayers, MD  07/24/2015    ---    Note generated by eClinicalWorks EMR/PM Software ([email or web address])

## 2020-06-19 NOTE — Progress Notes (Signed)
* * *        **  [name]**    --- ---    26 Y old Female, DOB: 09-Jul-1975    [address] [place], [place] [address]    Home: [phone]    Provider: Fredrik Cove, MD        * * *    Telephone Encounter    ---    Answered by   Rodolph Bong  Date: 10/06/2016         Time: 10:21 AM    Reason   refill    --- ---            Action Taken   Bray,Paula 10/06/2016 10:22:13 AM > pt seen for annual 04/29/16.  07/11/16 switched to kariva due to heacaches on placebo. refill sent kariva            Refills  Start Kariva Tablet, 0.15-0.02/0.01 MG (21/5), Orally, 84 Tablet, 1  tablet, Once a day, 84 days, Refills=2    --- ---          * * *                ---          * * *          Patient: MYKA, HITZ DOB: 30-Oct-1975 Provider: Fredrik Cove, MD  10/06/2016    ---    Note generated by eClinicalWorks EMR/PM Software ([email or web address])

## 2020-06-19 NOTE — Progress Notes (Signed)
* * *        **  Brandy [name]**    --- ---    44 Y old Female, DOB: 1976-02-26    [address] [place], [place] [address]    Home: [phone]    Provider: Fredrik Cove, MD        * * *    Telephone Encounter    ---    Answered by   Herminio Heads  Date: 08/07/2017         Time: 10:20 AM    Caller   Patient    --- ---            Reason   [facility]            Message                      Patient need refills sent to pharmacy, she is booked with [facility] on 6/7. Phone number and address confirmed.                Action Taken                      Brady,Ashley  08/07/2017 10:20:57 AM > RN      Rubin,Danielle , RN 08/07/2017 10:27:40 AM > one month rx sent                Refills  Continue Apri Tablet, 0.15-30 MG-MCG, Orally, 30, 1 tablet, Once a  day, 30, Refills=0    --- ---          * * *                ---          * * *          Patient: Brandy [name], Brandy [name] DOB: Jun 04, 1975 Provider: Fredrik Cove, MD  08/07/2017    ---    Note generated by eClinicalWorks EMR/PM Software ([email or web address])

## 2020-06-19 NOTE — Progress Notes (Signed)
* * *        **  [name]**    --- ---    54 Y old Female, DOB: 1976/03/15    [address] [place], [place] [address]    Home: [phone]    Provider: Fredrik Cove, MD        * * *    Telephone Encounter    ---    Answered by   Jeanene Erb  Date: 06/11/2017         Time: 01:52 PM    Reason   Rx request from [facility]    --- ---            Action Taken                      Rubin,Danielle , RN 06/11/2017 1:52:27 PM > Rx sent for one month supply. Pt due for annual.                Refills  Continue Kariva Tablet, 0.15-0.02/0.01 MG (21/5), Orally, 28 Tablet,  1 tablet, Once a day, 28 days, Refills=0    --- ---          * * *                ---          * * *          Patient: Brandy [name], Brandy [name] DOB: 03-12-1976 Provider: Fredrik Cove, MD  06/11/2017    ---    Note generated by eClinicalWorks EMR/PM Software ([email or web address])

## 2020-06-19 NOTE — Progress Notes (Signed)
* * *        **  Brandy [name]**    --- ---    64 Y old Female, DOB: Jan 30, 1976    [address], [place], [place] [address]    Home: [phone]    Provider: Fredrik Cove, MD        * * *    Telephone Encounter    ---    Answered by   Etheleen Nicks  Date: 01/11/2018         Time: 04:20 PM    Caller   pt    --- ---            Reason   ? about IUD-LMTC 10/21            Message                      pt has a question about her IUD that was placed on 12/25/17.  call her back at [phone]                Action Taken                      [facility]  01/11/2018 4:20:40 PM >       [employer] 01/11/2018 4:38:59 PM > LMTC      Veronesi,Wendy , RN 01/12/2018 2:45:12 PM > pt had IUD placed on 10/4 states she is having dark brown to brown d/c. Advised pt to monitor and to call us if she has any other symptoms or is having concerns                    * * *                ---          * * *          Patient: Brandy [name], Brandy [name] DOB: 26-Sep-1975 Provider: Fredrik Cove, MD  01/11/2018    ---    Note generated by eClinicalWorks EMR/PM Software ([email or web address])

## 2020-06-19 NOTE — Progress Notes (Signed)
* * *        Brandy [name]**    --- ---    45 Y old Female, DOB: 1975/11/24, External MRN: [id number]    Account Number: [id number]    [address] [place], [place]    Home: [phone]    Insurance: HMO [place] [employer] Payer ID: PAPER    PCP: Bernette Mayers, MD Referring: Bernette Mayers, MD External Visit ID:  [id number]    Appointment Facility: [facility]        * * *    02/05/2018   **Appointment Provider:** Fredrik Cove, MD **CHN#:** [phone]    --- ---    ---         **Current Medications**    ---    Taking     * Augmentin     ---    * Benadryl     ---    Rutha Bouchard     ---    * Sudafed , Notes: prn    ---    * Zyrtec 1 tab Oral     ---    Not-Taking/PRN    * Apri 0.15-30 MG-MCG Tablet 1 tablet Orally Once a day    ---    * Apri 0.15-30 MG-MCG Tablet 1 tablet Orally Once a day    ---    * Apri 0.15-30 MG-MCG Tablet 1 tablet Orally Once a day    ---    * Apri 0.15-30 MG-MCG Tablet 1 tablet Orally Once a day    ---    * Flonase 50 MCG/ACT Suspension 1 spray in each nostril Nasally Once a day    ---    * Ibuprofen 400 mg Tablet 1 tablet Orally as needed    ---    * Kariva 0.15-0.02/0.01 MG (21/5) Tablet 1 tablet Orally Once a day    ---    * Kariva 0.15-0.02/0.01 MG (21/5) Tablet 1 tablet Orally Once a day    ---    * Meclizine HCl 25 mg Tablet 1 tablet as needed Orally Once a day    ---    * Mirena     ---    * Misoprostol 200 MCG Tablet as directed intravaginally 1 tab placed into vaginal night prior to procedure, 1 tab buccual 2 hours prior to procedure    ---    * Medication List reviewed and reconciled with the patient    ---      Past Medical History    ---       Back pain/scoliosis.        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        ---    Sinusitis.        ---       **Surgical History**    ---       C/S 04/2005    ---    C/S 12/2009    ---       **Family History**    ---       Mother: alive, diabetes, hyperlipidemia, hypertension    ---    Father: unknown    ---     Paternal Grand Mother: deceased    ---    Paternal Grand Father: deceased, colon CA, Breast CA, diagnosed with Cancer    ---    1 brother(s) , 1 sister(s) . 1 son(s) , 1 daughter(s) - healthy.    ---  mother-htn,dm    father-htn,drug alcohol    pgf-breast cancer    No Colon CA    sister and one paternal cousin: kidney disease (heriditary, only one kidney)    denied pre-mature CAD in family.    ---       **Social History**    ---    Lives with: Husband and children.    Exercise: gym workouts,spinning- 3-4x/week.    Sexual history  Currently active with your husband only, no Hx of STD.    Condom use: Never.    Work/Occupation: Education officer, community office work.    Alcohol  Denies.    Illicit drugs: Denies.    Seat Belts: always.    Driver's License/Permit: yes, drives regularly.    Caffeine: drink coffee.    Marital Status  Married.    Tobacco history: Never smoked.   lives with husband and kids (5 and 8 YO feel  safe at home.    ---       **Allergies**    ---       Sulfa    ---    seasonal allergies    ---    [Allergies Verified]       **Hospitalization/Major Diagnostic Procedure**    ---       C/S 04/2005    ---    C/S 12/2009    ---       **Review of Systems**    ---     _WCS General:_ :    Not Present: Fatigue, Fever, Night Sweats, Weight Gain > 10 lbs, Weight Loss >  10 lbs.    _WCS Gastrointestinal:_ :    Not Present: Abdominal Pain, Abdominal Bloating, Bloody Stool, Change in Bowel  Habits, Constipation, Diarrhea, Gas, Indigestion, Nausea, Rectal Bleeding,  Vomiting.    _WCS Female Genitourinary:_ :    Not Present: Dyspareunia, Dysuria, Abnormal Vaginal Discharge, Frequency,  Hematuria, Incontinence, Menstrual Irregularities, Urgency, Urinary Retention,  Vaginal Bleeding.            **History of Present Illness**    ---     _GENERAL_ :    45yo had Kyleena placed 10/4    LMP 11/12 very light.       **Vital Signs**    ---    Ht-in 5'8, Wt-lbs 213, BMI 41.59, BP 116/82, BSA 2.02, Wt-kg 96.62, LMP:  02/02/18.        **Physical Examination**    ---     _OB/Gyn General_ :    General Appearance: well-appearing, no acute distress. Mental Status: alert  and oriented. Mood/Affect: pleasant.    _OB/Gyn Abdomen_ :    General: soft. Tenderness: nontender. Masses: none.    _OB/Gyn Genitourinary_ :    External Genitalia: normal. Vagina: normal, pink/moist mucosa, no lesions, no  abnormal discharge. Cervix: normal appearance, no lesions/discharge/bleeding,  no cervical motion tenderness, IUD string visualized. Uterus: normal  size/shape/consistency. Adnexa: no mass or tenderness bilaterally. Urethra  Normal. Bladder Normal .          **Assessments**    ---    1\. IUD (intrauterine device) in place - Z97.5 (Primary)    ---    **Appointment Provider:** Fredrik Cove, MD    Electronically signed by Dennison Bulla MD on 02/05/2018 at 11:47 AM EST    Sign off status: Completed        * * *        [facility]    [address] [place]  [place], [place] [address]    Tel: [phone]    Fax: [phone]              * * *          Patient: Brandy [name], Brandy [name] DOB: Sep 07, 1975 Progress Note: Fredrik Cove,  MD 02/05/2018    ---    Note generated by eClinicalWorks EMR/PM Software ([email or web address])

## 2020-06-19 NOTE — Progress Notes (Signed)
* * *        Brandy [name]**    --- ---    62 Y old Female, DOB: 1975/09/17, External MRN: [id number]    Account Number: [id number]    [address] [place], [place]    Home: [phone]    Insurance: [employer]MO [employer] Payer ID: PAPER    PCP: Bernette Mayers, MD Referring: Bernette Mayers, MD External Visit ID:  [id number]    Appointment Facility: [facility]        * * *    08/28/2017   **Appointment Provider:** Fredrik Cove, MD **CHN#:** [phone]    --- ---    ---         **Current Medications**    ---    Taking     * Augmentin     ---    * Benadryl     ---    * Kariva 0.15-0.02/0.01 MG (21/5) Tablet 1 tablet Orally Once a day    ---    * Sudafed , Notes: prn    ---    * Zyrtec 1 tab Oral     ---    Not-Taking/PRN    * Apri 0.15-30 MG-MCG Tablet 1 tablet Orally Once a day    ---    * Apri 0.15-30 MG-MCG Tablet 1 tablet Orally Once a day    ---    * Apri 0.15-30 MG-MCG Tablet 1 tablet Orally Once a day    ---    * Apri 0.15-30 MG-MCG Tablet 1 tablet Orally Once a day    ---    * Flonase 50 MCG/ACT Suspension 1 spray in each nostril Nasally Once a day    ---    * Ibuprofen 400 mg Tablet 1 tablet Orally as needed    ---    * Kariva 0.15-0.02/0.01 MG (21/5) Tablet 1 tablet Orally Once a day    ---    * Meclizine HCl 25 mg Tablet 1 tablet as needed Orally Once a day    ---    * Medication List reviewed and reconciled with the patient    ---      Past Medical History    ---       Back pain/scoliosis.        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        ---    Sinusitis.        ---       **Surgical History**    ---       C/S 04/2005    ---    C/S 12/2009    ---       **Family History**    ---       Mother: alive, diabetes, hyperlipidemia, hypertension    ---    Father: unknown    ---    Paternal Grand Mother: deceased    ---    Paternal Grand Father: deceased, colon CA, Breast CA, diagnosed with Cancer    ---    1 brother(s) , 1 sister(s) . 1 son(s) , 1 daughter(s) - healthy.     ---    mother-htn,dm    father-htn,drug alcohol    pgf-breast cancer    No Colon CA    sister and one paternal cousin: kidney disease (heriditary, only one kidney)    denied pre-mature CAD in family.    ---       **  Social History**    ---    Tobacco history: Never smoked.    Marital Status  Married.    Caffeine: drink coffee.    Sexual history  Currently active with your husband only, no Hx of STD.    Work/Occupation: Education officer, community office work.    Exercise: gym workouts,spinning- 3-4x/week.    Alcohol  Denies.    Illicit drugs: Denies.    Condom use: Never.    Driver's License/Permit: yes, drives regularly.    Seat Belts: always.    Lives with: Husband and children.   lives with husband and kids (5 and 8 YO  feel safe at home.    ---       **Gyn History**    ---    H/O Hx Abnormal pap smear 2000-colposcopy & repeat pap nl.    Birth control ocp.    GPAL: G2P2.    Last Pap Smear 04/2016- negative/hpv negative.      **Allergies**    ---       Sulfa    ---    seasonal allergies    ---    [Allergies Verified]       **Hospitalization/Major Diagnostic Procedure**    ---       C/S 04/2005    ---    C/S 12/2009    ---       **Review of Systems**    ---     _[facility]_ :    General: no fever, no weakness, no memory loss, no swollen glands, no  bruising, no weight loss, no constant fatigue, no physical abuse, no emotional  abuse. Skin: no changing moles, no rash, no jaundice. HEENT: no visual  problems, no eye pain, no ear pain, no difficulty hearing, no headaches, no  sinus trouble. Respiratory: no shortness of breath, no cough , no wheezing.  Breasts: no breast lumps, no breast pain, no nipple discharge. Cardiovascular:  no chest pains, no palpitations, no swelling, no dizziness, no murmurs.  Gastrointestinal: no nausea , no vomiting, no loss of appetite, no  constipation, no diarrhea. Female Genitourinary: __. Musculoskeletal: no joint  pain, no joint swelling, no joint stiffness. Neurological: __. Psychiatric:  no  anxiety, no depression.    Patient intake reviewed with patient and scanned into system.         **Reason for Appointment**    ---       1\. ANNUAL    ---       **History of Present Illness**    ---     _GENERAL_ :    45 yo married, + SA    bc- ocp    menses light.       **Vital Signs**    ---    Pain scale 0, Ht-in 5'8, Wt-lbs 207, BMI 40.42, BP 122/84, LMP: 3wks.       **Physical Examination**    ---     _[facility] Exam_ :    General: General appearance: well-appearing, no acute distress, Mental status:  alert and oriented, Mood/affect: pleasant. Neck: Thyroid: unremarkable, Neck  mass: none. Chest/Thorax: Breath sounds: clear to auscultation. Heart: Rythm:  regular, Murmurs: none, Rate: normal. Breasts: General: no masses, tenderness,  skin changes, or nipple abnormality, Skin: unremarkable, Axilla: no palpable  masses. Abdomen: General: soft, Tenderness: nontender, Masses: none, Hernia:  absent, Distention: none. Skin: General: warm, moist, no rash. Genitourinary:  External Genitalia: normal, Vagina: normal, pink/moist mucosa, no lesions, no  abnormal discharge, Urethral  Meatus: normal, Urethra: normal, Bladder: normal,  Cervix: normal, Uterus: normal, Adnexa/Parametria: normal, Anus/Perinuem:  normal, Groin nodes: normal. Pap done.          **Assessments**    ---    1\. Encounter for gynecological examination - Z01.419 (Primary)    ---    2\. Oral contraceptive pill surveillance - Z30.41    ---       **Treatment**    ---       **1\. Encounter for gynecological examination**    Refill Kariva Tablet, 0.15-0.02/0.01 MG (21/5), 1 tablet, Orally, Once a day,  90 days, 90, Refills 3    ---       **Preventive Medicine**    ---       Patient instructed to call office within 2-3 weeks if she does not receive  results from today's visit. Explained results letter sent with all tests  performed in office. Patient agrees to call if she does not receive letter.        ---       **Follow Up**    ---    1 Year     **Appointment Provider:** Fredrik Cove, MD    Electronically signed by Dennison Bulla MD on 08/28/2017 at 01:05 PM EDT    Sign off status: Completed        * * [facility]    [address] [place], [place] [address]    Tel: [phone]    Fax: [phone]              * * *          Patient: Brandy [name], Brandy [name] DOB: 04/29/1975 Progress Note: Fredrik Cove,  MD 08/28/2017    ---    Note generated by eClinicalWorks EMR/PM Software ([email or web address])

## 2020-06-19 NOTE — Progress Notes (Signed)
* * *        **  Brandy [name]**    --- ---    16 Y old Female, DOB: Mar 27, 1975    [address], [place], [place] [address]    Home: [phone]    Provider: Fredrik Cove, MD        * * *    Telephone Encounter    ---    Answered by   Darien Ramus  Date: 03/21/2016         Time: 10:44 AM    Caller   pt    --- ---            Reason   refill            Message                      pt has appt sched with KJ for 1/26, needs [facility] script sent to pharm in the meantime please   ([facility])                Action Taken   [facility] 03/21/2016 10:45:48 AM > Bray,Paula 03/21/2016  11:12:11 AM > refill apri for 3 months sent 03/14/16                * * *                ---          * * *          Patient: Brandy [name] DOB: 1975-11-24 Provider: Fredrik Cove, MD  03/21/2016    ---    Note generated by eClinicalWorks EMR/PM Software ([email or web address])

## 2020-06-19 NOTE — Progress Notes (Signed)
OV 06/19/2020  Subjective:  Patient ID: Faith Willis, female , DOB: 1975-06-12 , age 45 y.o. , MRN: [id number] , ADDRESS: [address] [place] [address] PCP Patient, No Pcp Per Patient Care Team: Patient, No Pcp Per as PCP - General (General Practice)  This Provider for this visit: Treatment Team:  Attending Provider: Kalman Shan, MD    06/19/2020 -   Chief Complaint  Patient presents with  . Consult    Referral for sarcoid     HPI Faith Willis 45 y.o. -new consultation for evaluation of sarcoidosis.  Patient is originally from the [place] area.  She relocated to [facility] area [place] 1 hour from [place] in August 2021.  She says that sometime early 2020 she started having back pain she had an MRI.  This resulted in some findings in the chest.  Then early 2020/summer 2020 she underwent bronchoscopy and nodules are seen in the CT scan of the chest.  At the presumptive diagnosis of sarcoidosis was given.  By this time it was October 2020.  This was in [facility] in [place] then in November 2021 she ended up with hemoptysis and actual diagnosis of COVID-19.  She says she was not hospitalized for this.  Subsequently she got admitted to [facility] in [place], [place]..  This time she underwent a mediastinoscopy.  She was then given a formal diagnosis of sarcoid.  She was on prednisone through early/spring 2021 and then weaned off.  Since then she has been doing well.  Then in the summer 2020 when she relocated to [place].  Overall stable.  Occasional shortness of breath or fatigue and cough at rest.  Sometimes she find some scattered nodules like in her feet or like her left thigh that are very small and discrete.  There is no redness with this.  They wax and wane.  Otherwise she is feeling good.  She works as a Scientist, physiological.  She has 2 kids her husband works in Consulting civil engineer.  They seem to like [place].   She has established herself in the [place] [facility] but was referred here because of pulmonary requests in consultation.  Currently not on steroids.    CT Chest data  No results found.    PFT  No flowsheet data found.     has no past medical history on file.   reports that she has never smoked. She has never used smokeless tobacco.   The histories are not reviewed yet. Please review them in the "History" navigator section and refresh this SmartLink.  Allergies  Allergen Reactions  . Sulfa Antibiotics Hives and Rash  . Cefuroxime Diarrhea and Nausea And Vomiting    diarrhea diarrhea diarrhea diarrhea   . Hydrocodone-Acetaminophen     Other reaction(s): Dizziness Passed out Passed out Passed out Passed out     Immunization History  Administered Date(s) Administered  . Influenza,inj,Quad PF,6+ Mos 12/31/2016  . Moderna Sars-Covid-2 Vaccination 04/22/2019, 05/20/2019, 12/14/2019  . Tdap 07/08/2016    No family history on file.   Current Outpatient Medications:  .  desogestrel-ethinyl estradiol (MIRCETTE) 0.15-0.02/0.01 MG (21/5) tablet, Take 1 tablet by mouth daily., Disp: , Rfl:       Objective:   Vitals:   06/19/20 1149  BP: 118/78  Pulse: 85  Temp: 97.6 F (36.4 C)  TempSrc: Tympanic  SpO2: 98%  Weight: 189 lb (85.7 kg)  Height: 5\' 8"  (1.727 m)  Estimated body mass index is 28.74 kg/m as calculated from the following:   Height as of this encounter: 5\' 8"  (1.727 m).   Weight as of this encounter: 189 lb (85.7 kg).  @WEIGHTCHANGE @    06/19/20 1149  Weight: 189 lb (85.7 kg)     Physical Exam  General Appearance:    Alert, cooperative, no distress, appears stated age - loks well , Deconditioned looking - no , OBESE  - no, Sitting on Wheelchair -  no  Head:    Normocephalic, without obvious abnormality, atraumatic  Eyes:    PERRL, conjunctiva/corneas clear,  Ears:    Normal TM's and external ear canals, both ears   Nose:   Nares normal, septum midline, mucosa normal, no drainage    or sinus tenderness. OXYGEN ON  - no . Patient is @ ra   Throat:   Lips, mucosa, and tongue normal; teeth and gums normal. Cyanosis on lips - no  Neck:   Supple, symmetrical, trachea midline, no adenopathy;    thyroid:  no enlargement/tenderness/nodules; no carotid   bruit or JVD  Back:     Symmetric, no curvature, ROM normal, no CVA tenderness  Lungs:     Distress - no , Wheeze no, Barrell Chest - no, Purse lip breathing - no, Crackles - no   Chest Wall:    No tenderness or deformity.    Heart:    Regular rate and rhythm, S1 and S2 normal, no rub   or gallop, Murmur - no  Breast Exam:    NOT DONE  Abdomen:     Soft, non-tender, bowel sounds active all four quadrants,    no masses, no organomegaly. Visceral obesity - no  Genitalia:   NOT DONE  Rectal:   NOT DONE  Extremities:   Extremities - normal, Has Cane - no, Clubbing - no, Edema - no  Pulses:   2+ and symmetric all extremities  Skin:   Stigmata of Connective Tissue Disease - no  Lymph nodes:   Cervical, supraclavicular, and axillary nodes normal  Psychiatric:  Neurologic:   Pleasant - yes, Anxious - no, Flat affect - no  CAm-ICU - neg, Alert and Oriented x 3 - yes, Moves all 4s - yes, Speech - normal, Cognition - intact         Assessment:       ICD-10-CM   1. History of sarcoidosis  Z86.2   2. Personal history of COVID-19  Z86.16        Plan:     Patient Instructions     ICD-10-CM   1. History of sarcoidosis  Z86.2   2. Personal history of COVID-19  Z86.16      Plan   - sign release to get records from [facility] in [place] -Sign release to get results and records from [facility] in [place] -Do high-resolution CT chest supine and prone in the next few to several weeks -Do full pulmonary function test in the next few to several weeks  Follow-up -Return to see Dr. [facility] in the month of May 2022  [schedule open soon] -30-minute slot to review results.  Okay to do telephone visit [though not ideal] to review test results.     SIGNATURE    Dr. Marchelle Gearing, M.D., F.C.C.P,  Pulmonary and Critical Care Medicine Staff Physician, [facility] Center Director - Interstitial Lung Disease  Program  Pulmonary Fibrosis [facility] at [place]  Pulmonary [place], [place], [address]  Pager: [phone], If no answer or between  15:00h - 7:00h: call [phone] Telephone: [phone]  12:33 PM 06/19/2020

## 2020-06-19 NOTE — Addendum Note (Signed)
Addended by: Marcellus Scott on: 06/19/2020 12:36 PM   Modules accepted: Orders

## 2020-06-19 NOTE — Patient Instructions (Signed)
ICD-10-CM   1. History of sarcoidosis  Z86.2   2. Personal history of COVID-19  Z86.16      Plan   - sign release to get records from [facility] in [place] -Sign release to get results and records from [facility] in [place] -Do high-resolution CT chest supine and prone in the next few to several weeks -Do full pulmonary function test in the next few to several weeks  Follow-up -Return to see Dr. Marchelle Gearing in the month of May 2022 [schedule open soon] -30-minute slot to review results.  Okay to do telephone visit [though not ideal] to review test results.

## 2020-06-29 ENCOUNTER — Other Ambulatory Visit: Payer: BC Managed Care – PPO

## 2020-07-12 ENCOUNTER — Other Ambulatory Visit: Payer: BC Managed Care – PPO

## 2020-07-13 ENCOUNTER — Telehealth: Payer: Self-pay | Admitting: Internal Medicine

## 2020-07-13 NOTE — Telephone Encounter (Signed)
Faith Willis records from [place]. Wil keep it wth me on desk by the [employer] coffee mug and I can get it when she makes her visit  Pls give her first avail to discuss results of PFT and CT that is being done end of April. Ok to see end of may 2022 as well  Thanks  MR

## 2020-07-16 ENCOUNTER — Telehealth: Payer: Self-pay | Admitting: Internal Medicine

## 2020-07-16 NOTE — Telephone Encounter (Signed)
Pt scheduled to have the HRCT and PFT performed tomorrow 4/26. No f/u OV scheduled yet.  Attempted to call pt to get her scheduled for an OV but unable to reach. Left message for her to return call.

## 2020-07-16 NOTE — Telephone Encounter (Signed)
Called and spoke with pt and she is wanting to know if MR would do her 08/27/2020 OV as a televisit since she lives in [place].  MR please advise if ok to change.  Thanks

## 2020-07-17 ENCOUNTER — Ambulatory Visit (INDEPENDENT_AMBULATORY_CARE_PROVIDER_SITE_OTHER)
Admission: RE | Admit: 2020-07-17 | Discharge: 2020-07-17 | Disposition: A | Discharge status: Home / Self Care | Payer: BC Managed Care – PPO | Source: Ambulatory Visit | Attending: Internal Medicine | Admitting: Internal Medicine

## 2020-07-17 ENCOUNTER — Other Ambulatory Visit: Payer: Self-pay

## 2020-07-17 ENCOUNTER — Ambulatory Visit (INDEPENDENT_AMBULATORY_CARE_PROVIDER_SITE_OTHER): Payer: BC Managed Care – PPO | Admitting: Internal Medicine

## 2020-07-17 DIAGNOSIS — Z862 Personal history of diseases of the blood and blood-forming organs and certain disorders involving the immune mechanism: Secondary | ICD-10-CM

## 2020-07-17 LAB — PULMONARY FUNCTION TEST
DL/VA % pred: 130 %
DL/VA: 5.57 ml/min/mmHg/L
DLCO cor % pred: 112 %
DLCO cor: 27.77 ml/min/mmHg
DLCO unc % pred: 112 %
DLCO unc: 27.77 ml/min/mmHg
FEF 25-75 Post: 2.9 L/sec
FEF 25-75 Pre: 2.25 L/sec
FEF2575-%Change-Post: 29 %
FEF2575-%Pred-Post: 88 %
FEF2575-%Pred-Pre: 68 %
FEV1-%Change-Post: 7 %
FEV1-%Pred-Post: 83 %
FEV1-%Pred-Pre: 77 %
FEV1-Post: 2.8 L
FEV1-Pre: 2.6 L
FEV1FVC-%Change-Post: 6 %
FEV1FVC-%Pred-Pre: 93 %
FEV6-%Change-Post: 1 %
FEV6-%Pred-Post: 84 %
FEV6-%Pred-Pre: 83 %
FEV6-Post: 3.45 L
FEV6-Pre: 3.4 L
FEV6FVC-%Pred-Post: 102 %
FEV6FVC-%Pred-Pre: 102 %
FVC-%Change-Post: 1 %
FVC-%Pred-Post: 82 %
FVC-%Pred-Pre: 81 %
FVC-Post: 3.45 L
FVC-Pre: 3.4 L
Post FEV1/FVC ratio: 81 %
Post FEV6/FVC ratio: 100 %
Pre FEV1/FVC ratio: 76 %
Pre FEV6/FVC Ratio: 100 %
RV % pred: 100 %
RV: 1.85 L
TLC % pred: 92 %
TLC: 5.25 L

## 2020-07-17 NOTE — Patient Instructions (Signed)
Full PFT performed today. °

## 2020-07-17 NOTE — Progress Notes (Signed)
Full PFT performed today. °

## 2020-07-18 NOTE — Telephone Encounter (Signed)
I called and spoke with patient regarding MR recs. She verbalized understanding and will be here in office for visit. Nothing further needed

## 2020-07-18 NOTE — Telephone Encounter (Signed)
The stack of papers is HUGE. Need 30 min visit. Can be video or face to face but not telephone

## 2020-07-19 NOTE — Telephone Encounter (Signed)
Pt has been scheduled for OV with MR 6/6. Nothing further needed.

## 2020-07-26 NOTE — Progress Notes (Signed)
Findings of sarcoid on CT chst. Will address in June followp

## 2020-07-29 ENCOUNTER — Encounter: Payer: Self-pay | Admitting: *Deleted

## 2020-08-27 ENCOUNTER — Ambulatory Visit: Payer: BC Managed Care – PPO | Admitting: Internal Medicine

## 2020-11-09 ENCOUNTER — Ambulatory Visit: Payer: BC Managed Care – PPO | Admitting: Internal Medicine

## 2020-12-21 ENCOUNTER — Encounter: Payer: Self-pay | Admitting: Internal Medicine

## 2020-12-21 ENCOUNTER — Ambulatory Visit (INDEPENDENT_AMBULATORY_CARE_PROVIDER_SITE_OTHER): Payer: BC Managed Care – PPO | Admitting: Internal Medicine

## 2020-12-21 ENCOUNTER — Other Ambulatory Visit: Payer: Self-pay

## 2020-12-21 ENCOUNTER — Telehealth: Payer: Self-pay | Admitting: Internal Medicine

## 2020-12-21 VITALS — BP 120/74 | HR 93 | Temp 97.9°F | Ht 68.0 in | Wt 197.0 lb

## 2020-12-21 DIAGNOSIS — R059 Cough, unspecified: Secondary | ICD-10-CM

## 2020-12-21 DIAGNOSIS — D862 Sarcoidosis of lung with sarcoidosis of lymph nodes: Secondary | ICD-10-CM

## 2020-12-21 DIAGNOSIS — R918 Other nonspecific abnormal finding of lung field: Secondary | ICD-10-CM | POA: Diagnosis not present

## 2020-12-21 DIAGNOSIS — R590 Localized enlarged lymph nodes: Secondary | ICD-10-CM

## 2020-12-21 DIAGNOSIS — Z23 Encounter for immunization: Secondary | ICD-10-CM | POA: Diagnosis not present

## 2020-12-21 DIAGNOSIS — R61 Generalized hyperhidrosis: Secondary | ICD-10-CM

## 2020-12-21 MED ORDER — ARNUITY ELLIPTA 100 MCG/ACT IN AEPB: 1.0000 | INHALATION_SPRAY | Freq: Every day | RESPIRATORY_TRACT | 5 refills | Status: DC

## 2020-12-21 NOTE — Telephone Encounter (Signed)
Faith Willis (cc Erskine Squibb) - > please tell the patient that after she left I was able to spent substantial amount of time reviewing all her records from [place].  She definitely had a big sarcoid flare to his end of 2020 following COVID.  After that with the excellent treatment at [facility] her lung function normalized.  Now it has been a year and a half since she moved to [place] and off steroids.  The April 2022 pulmonary function test compared to the one from April 2021 in [place] shows a slight decline.    Plan  - So the inhaled steroid is not going to work we might need to consider other options for her sarcoid -We are therefore changing her appointment from inside of 3-6 months to 2 months -She should also get a full pulmonary function test -Should see Dr. Mechele Collin

## 2020-12-21 NOTE — Progress Notes (Signed)
Record review from [facility] in [place] in [place] [address]  -Evaluation note January 13, 2019 by Dr. 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  She initially presented with back pain for which she had an MRI and this showed sternal thickening which resulted in CT chest.  CT chest suggested scattered small calcified pulmonary nodules and diffuse mediastinal adenopathy.  She was asymptomatic at that time and pulmonary function test was normal.  There differential diagnosis of sarcoidosis versus blastomycosis because she had previously lived in [place].  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  Her pulmonary function test 01/13/2019 is documented below in comparison to ours and appears stable.  She underwent endobronchial ultrasound 02/08/2019.  No evidence of malignancy.  Showed eosinophils with goblet and reserve cell hyperplasia.  This was on transbronchial biopsy right lung.  The EBUS was nondiagnostic.At this point in time she was exercising quite well with spin classes and Zumba and did not have any respiratory complaints.  No skin lesions either.  Of note CT scan of the chest at this time showed right lower lobe 2 x 6 mm nodule    = Then by 02/27/2019 she had COVID.  The plan was not to treat her sarcoid because of her early stage and otherwise feeling well.  But by 03/19/2019 she got much worse and presented to the ED.  CT angiogram chest showed progression of her mediastinal and hilar adenopathy with obliteration of the bronchus intermedius underwent medius anoscopy and according to the notes it showed necrotizing granulomas and lymph nodes with surrounding fibrinopurulent exudate and chronic inflammation and histiocytic aggregates she was started on prednisone 40 mg.  At follow-up January 2021 she was feeling better  and back at baseline.  But not yet doing exercise.  She is just having some intermittent dry cough.  During this time both her father and father-in-law died from COVID.  Pulmonary function test January 2021 shows significant decline from pre-COVID.  Later in January 2021 when she had CT scan of the chest that shows improvement in mediastinal and hilar adenopathy with resolution of the obstruction of the bronchus intermedius, right middle and right lower lobe bronchi.  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  Vasculitis panel and everything else was negative.  This correlated with an improvement in pulmonary function test in February 2021 in [place] below].  At this point in time sarcoid tapering plan was started.  The understanding was that if sarcoid got worse again they would commit to methotrexate.   -At follow-up in April 2021 while tapering the prednisone and arthralgia was noted.  Ophthalmology exam by this time was considered normal.  Pulmonary function test suggested stability versus slight worsening.  At this point in time prednisone was dropped to 5 mg/day.  Bactrim prophylaxis was stopped.  Close monitoring approach was adopted.   -Summer 2021 relocated to [facility]   -March 2022 establish with pulmonary in [facility] health.   OV 06/19/2020  Subjective:  Patient ID: Faith Willis, female , DOB: July 05, 1975 , age 29 y.o. , MRN: [id number] , ADDRESS: [address] [place] [address] PCP Patient, No Pcp Per Patient Care Team: Patient, No Pcp Per as PCP - General (General Practice)  This Provider for this visit: Treatment Team:  Attending Provider: Kalman Shan, MD  06/19/2020 -   Chief Complaint  Patient presents with   Consult    Referral for sarcoid     HPI  HPI Visit [facility] 2022  [facility] 45 y.o. -new consultation for evaluation of sarcoidosis.  Patient is originally from the [place] area.   She relocated to [facility] area [place] 1 hour from [place] in August 2021.  She says that sometime early 2020 she started having back pain she had an MRI.  This resulted in some findings in the chest.  Then early 2020/summer 2020 she underwent bronchoscopy and nodules are seen in the CT scan of the chest.  At the presumptive diagnosis of sarcoidosis was given.  By this time it was October 2020.  This was in [facility] in [place] then in November 2021 she ended up with hemoptysis and actual diagnosis of COVID-19.  She says she was not hospitalized for this.  Subsequently she got admitted to [facility] in [place], [place]..  This time she underwent a mediastinoscopy.  She was then given a formal diagnosis of sarcoid.  She was on prednisone through early/spring 2021 and then weaned off.  Since then she has been doing well.  Then in the summer 2020 when she relocated to [place].  Overall stable.  Occasional shortness of breath or fatigue and cough at rest.  Sometimes she find some scattered nodules like in her feet or like her left thigh that are very small and discrete.  There is no redness with this.  They wax and wane.  Otherwise she is feeling good.  She works as a Scientist, physiological.  She has 2 kids her husband works in Consulting civil engineer.  They seem to like [place].  She has established herself in the [place] health [facility] but was referred here because of pulmonary requests in consultation.  Currently not on steroids.   OV 12/21/2020  Subjective:  Patient ID: Faith Willis, female , DOB: 1975-11-10 , age 22 y.o. , MRN: [id number] , ADDRESS: [address] [place] [address] PCP Patient, No Pcp Per (Inactive) Patient Care Team: Patient, No Pcp Per (Inactive) as PCP - General (General Practice)  This Provider for this visit: Treatment Team:  Attending Provider: Kalman Shan, MD    12/21/2020 -   Chief Complaint  Patient presents with    Follow-up    Pt states she gets a lot of burning sensations in her lungs. Also states that she has a cough that happens all throughout the day.     HPI Faith Willis 45 y.o. -presents for follow-up.  In the interim I have gotten her records from [place].  It is reviewed below.  She continues to do overall well.  Initially she said she is asymptomatic but later when I started talking to her more she said that she has mild intermittent dry cough.  She also has mild intermittent night sweats.  Her CT scan of the chest shows mediastinal adenopathy with right greater than left tree-in-bud micronodular infiltrates consistent with pulmonary airway sarcoidosis.  She will have a flu shot.  She is now working as a Scientist, physiological at a [employer].  She lives in [place] away.  She is here to review the results.  I had this opportunity at this visit to review and transcribe all the records from [place].  I put it up along with the previous note so that it is in chronological order Reesa Chew will send the [facility] records for scanning     -  Apilr ulmonary function test in 2022 here in [place]  -much improved from the time of admission but?  Is declining slowly since coming off steroids   CT Chest data 07/17/20 (her old scans include 03/19/2019 at [facility] and 11/30/2018 at [facility] -not available for visualization in comparison)  Narrative & Impression  CLINICAL DATA:  Sarcoid.  Post COVID.  Persistent cough.   EXAM: CT CHEST WITHOUT CONTRAST   TECHNIQUE: Multidetector CT imaging of the chest was performed following the standard protocol without intravenous contrast. High resolution imaging of the lungs, as well as inspiratory and expiratory imaging, was performed.   COMPARISON:  None.   FINDINGS: Cardiovascular: Heart is at the upper limits of normal in size. No pericardial effusion.   Mediastinum/Nodes: Mediastinal adenopathy measures up to 11 mm in the low right  paratracheal station. Subcarinal lymph node measures 1.5 cm. Calcified mediastinal and bihilar lymph nodes. Prepericardiac lymph nodes measure up to 11 mm. Axillary lymph nodes are not enlarged by CT size criteria. Esophagus is grossly unremarkable.   Lungs/Pleura: There is centrally predominant peribronchovascular nodularity with mild traction bronchiectasis. Largest individual nodule measures 8 mm in the peripheral right lower lobe (5/103). Narrowing of the lateral segment right middle lobe bronchus, likely due to adenopathy. Negative for subpleural reticulation, ground-glass, architectural distortion or honeycombing. No pleural fluid. Airway is otherwise unremarkable. Mild air trapping.   Upper Abdomen: Visualized portions of the liver, adrenal glands and kidneys are unremarkable. Probable splenule between the proximal stomach and left adrenal gland. Visualized portions of the spleen, pancreas and stomach are otherwise unremarkable.   Musculoskeletal: Mild degenerative changes in the spine. No worrisome lytic or sclerotic lesions.   IMPRESSION: 1. Mediastinal/hilar and pulmonary parenchymal findings of sarcoid. Associated narrowing of the right middle lobe lateral segmental bronchus. 2. Largest individual nodule measures 8 mm in the peripheral right lower lobe and is likely due to sarcoid. Non-contrast chest CT at 3-6 months is recommended, as clinically indicated. If the nodules are stable at time of repeat CT, then future CT at 18-24 months (from today's scan) is considered optional for low-risk patients, but is recommended for high-risk patients. This recommendation follows the consensus statement: Guidelines for Management of Incidental Pulmonary Nodules Detected on CT Images: From the Fleischner Society 2017; Radiology 2017; 284:228-243. 3. Mild air trapping is indicative of small airways disease.     Electronically Signed   By: Leanna Battles M.D.   On: 07/17/2020  12:07      PFT  Results for BALJIT, LIEBERT (MRN [id number]) as of 12/21/2020 17:36  Ref. Range 01/13/2019 in [place] 04/14/19 [place] [facility] covid [facility] 05/05/19 [place] - [place]oids 06/30/19 [place] - serid taper 07/17/2020 10:52  FVC-Pre Latest Units: L 3.54 L / 85% 1.6 3.7L 3.6L 3.40  FVC-%Pred-Pre Latest Units: % 85% 55% 89%  81  FEV1-Pre Latest Units: L 2.091 1.34 2.8L 2.7L 2.60  FEV1-%Pred-Pre Latest Units: % 86% 62% 83% 82% 77  Pre FEV1/FVC ratio Latest Units: % 82 82 93  76  Results for MARTIZA, SPETH (MRN [id number]) as of 12/21/2020 17:36  Ref. Range 07/17/2020 10:52  TLC Latest Units: L 5.25  TLC % pred Latest Units: % 92  Results for CHERICA, HEIDEN (MRN [id number]) as of 12/21/2020 17:36  Ref. Range 07/17/2020 10:52  DLCO cor Latest Units: ml/min/mmHg 27.77  DLCO cor % pred Latest Units: % 112     has no past medical history on file.   reports that she has never  smoked. She has never used smokeless tobacco.  The histories are not reviewed yet. Please review them in the "History" navigator section and refresh this SmartLink.  Allergies  Allergen Reactions   Sulfa Antibiotics Hives and Rash   Cefuroxime Diarrhea and Nausea And Vomiting    diarrhea diarrhea diarrhea diarrhea    Hydrocodone-Acetaminophen     Other reaction(s): Dizziness Passed out Passed out Passed out Passed out     Immunization History  Administered Date(s) Administered   Influenza,inj,Quad PF,6+ Mos 12/31/2016, 12/21/2020   Moderna Sars-Covid-2 Vaccination 04/22/2019, 05/20/2019, 12/14/2019   Tdap 07/08/2016    No family history on file.   Current Outpatient Medications:    ascorbic acid (VITAMIN C) 100 MG tablet, Take by mouth., Disp: , Rfl:    azithromycin (ZITHROMAX) 250 MG tablet, Take by mouth daily., Disp: , Rfl:    calcium-vitamin D 250-100 MG-UNIT tablet, Take 1 tablet by mouth 2 (two) times daily., Disp: , Rfl:    cetirizine (ZYRTEC) 10 MG tablet, Take by mouth., Disp: ,  Rfl:    desogestrel-ethinyl estradiol (MIRCETTE) 0.15-0.02/0.01 MG (21/5) tablet, Take 1 tablet by mouth daily., Disp: , Rfl:    diphenhydrAMINE (BENADRYL) 25 mg capsule, Take by mouth., Disp: , Rfl:    Fluticasone Furoate (ARNUITY ELLIPTA) 100 MCG/ACT AEPB, Inhale 1 puff into the lungs daily., Disp: 30 each, Rfl: 5   omeprazole (PRILOSEC) 40 MG capsule, Take by mouth., Disp: , Rfl:       Objective:   Vitals:   12/21/20 1354  BP: 120/74  Pulse: 93  Temp: 97.9 F (36.6 C)  TempSrc: Oral  SpO2: 97%  Weight: 197 lb (89.4 kg)  Height: 5\' 8"  (1.727 m)    Estimated body mass index is 29.95 kg/m as calculated from the following:   Height as of this encounter: 5\' 8"  (1.727 m).   Weight as of this encounter: 197 lb (89.4 kg).  @WEIGHTCHANGE @    12/21/20 1354  Weight: 197 lb (89.4 kg)     Physical Exam  General: No distress. Looks well Neuro: Alert and Oriented x 3. GCS 15. Speech normal Psych: Pleasant Resp:  Barrel Chest - no.  Wheeze - no, Crackles - no, No overt respiratory distress CVS: Normal heart sounds. Murmurs - no Ext: Stigmata of Connective Tissue Disease - no HEENT: Normal upper airway. PEERL +. No post nasal drip        Assessment:       ICD-10-CM   1. Sarcoidosis, stage 2 (HCC)  D86.2     2. Mediastinal adenopathy  R59.0     3. Pulmonary nodules/lesions, multiple  R91.8     4. Cough in adult  R05.9     5. Night sweats  R61     6. Flu vaccine need  Z23     7. Need for immunization against influenza  Z23 Flu Vaccine QUAD 39mo+IM (Fluarix, Fluzone & Alfiuria Quad PF)         Plan:     Patient Instructions     ICD-10-CM   1. Sarcoidosis, stage 2 (HCC)  D86.2     2. Mediastinal adenopathy  R59.0     3. Pulmonary nodules/lesions, multiple  R91.8     4. Cough in adult  R05.9     5. Night sweats  R61     6. Flu vaccine need  Z23      You technically have stage II pulmonary sarcoidosis but most of it isy mediastinal  adenopathy so the dominant component is stage I  Lung function is mostly normal except for very mild obstruction  Possible that the cough is related to the mild lower level of pulmonary sarcoidosis  Plan - Start Arnuity once daily scheduled -Start albuterol as needed -Flu shot today   Follow-up - Dr. Mechele Collin her local sarcoid expert for second opinion and review in in 3 months - 6 months -Timing of CT scan of the chest without contrast follow-up can be decided at the time of seeing Dr. Everardo All -After seeing Dr. Everardo All you can decide to stay with or rotate between the 2 of [place]  Adddendum after she left:  - It is possible that the pulmonary function test is slowly declining since youmoved out of [place] and off steroids -> therefore best to repeat pulmonary function test at the time of follow-up.  We will change follow-up appointment to 2 months [instead of 3-6 months] but still with Dr. Dutch Gray    Dr. Kalman Shan, M.D., F.C.C.P,  Pulmonary and Critical Care Medicine Staff Physician, [facility] Director - Interstitial Lung Disease  Program  Pulmonary Fibrosis [facility] [place], [place], [address]  Pager: [phone], If no answer or between  15:00h - 7:00h: call [phone] Telephone: [phone]  5:58 PM 12/21/2020

## 2020-12-21 NOTE — Patient Instructions (Addendum)
ICD-10-CM   1. Sarcoidosis, stage 2 (HCC)  D86.2     2. Mediastinal adenopathy  R59.0     3. Pulmonary nodules/lesions, multiple  R91.8     4. Cough in adult  R05.9     5. Night sweats  R61     6. Flu vaccine need  Z23      You technically have stage II pulmonary sarcoidosis but most of it isy mediastinal adenopathy so the dominant component is stage I  Lung function is mostly normal except for very mild obstruction  Possible that the cough is related to the mild lower level of pulmonary sarcoidosis  Plan - Start Arnuity once daily scheduled -Start albuterol as needed -Flu shot today   Follow-up - Dr. Mechele Collin her local sarcoid expert for second opinion and review in in 3 months - 6 months -Timing of CT scan of the chest without contrast follow-up can be decided at the time of seeing Dr. Everardo All -After seeing Dr. Everardo All you can decide to stay with or rotate between the 2 of [place]  Adddendum after she left:  - It is possible that the pulmonary function test is slowly declining since youmoved out of [place] and off steroids -> therefore best to repeat pulmonary function test at the time of follow-up.  We will change follow-up appointment to 2 months [instead of 3-6 months] but still with Dr. Everardo All

## 2020-12-21 NOTE — Telephone Encounter (Signed)
Happy to see her. Please ensure PFTs are scheduled prior to her 2 month follow-up with me

## 2020-12-24 MED ORDER — ALBUTEROL SULFATE HFA 108 (90 BASE) MCG/ACT IN AERS: 2.0000 | INHALATION_SPRAY | Freq: Four times a day (QID) | RESPIRATORY_TRACT | 6 refills | Status: DC | PRN

## 2020-12-24 NOTE — Telephone Encounter (Signed)
I have called and LM on VM for the pt to call our office back.  Once she calls back we can get her scheduled for PFT and to see JE in 2 months.

## 2020-12-26 MED ORDER — ALBUTEROL SULFATE HFA 108 (90 BASE) MCG/ACT IN AERS: 2.0000 | INHALATION_SPRAY | Freq: Four times a day (QID) | RESPIRATORY_TRACT | 3 refills | Status: DC | PRN

## 2020-12-26 NOTE — Addendum Note (Signed)
Addended by: Delrae Rend on: 12/26/2020 03:21 PM   Modules accepted: Orders

## 2021-04-12 ENCOUNTER — Other Ambulatory Visit: Payer: Self-pay

## 2021-04-12 DIAGNOSIS — D862 Sarcoidosis of lung with sarcoidosis of lymph nodes: Secondary | ICD-10-CM

## 2021-04-15 ENCOUNTER — Ambulatory Visit (INDEPENDENT_AMBULATORY_CARE_PROVIDER_SITE_OTHER): Payer: BC Managed Care – PPO | Admitting: Pulmonary Disease

## 2021-04-15 ENCOUNTER — Encounter: Payer: Self-pay | Admitting: Pulmonary Disease

## 2021-04-15 ENCOUNTER — Other Ambulatory Visit: Payer: Self-pay

## 2021-04-15 VITALS — BP 122/68 | HR 82 | Temp 98.5°F | Ht 68.0 in | Wt 195.0 lb

## 2021-04-15 DIAGNOSIS — R053 Chronic cough: Secondary | ICD-10-CM

## 2021-04-15 DIAGNOSIS — R911 Solitary pulmonary nodule: Secondary | ICD-10-CM

## 2021-04-15 DIAGNOSIS — D862 Sarcoidosis of lung with sarcoidosis of lymph nodes: Secondary | ICD-10-CM | POA: Diagnosis not present

## 2021-04-15 LAB — PULMONARY FUNCTION TEST
DL/VA % pred: 116 %
DL/VA: 4.97 ml/min/mmHg/L
DLCO cor % pred: 96 %
DLCO cor: 23.74 ml/min/mmHg
DLCO unc % pred: 96 %
DLCO unc: 23.74 ml/min/mmHg
FEF 25-75 Post: 2.7 L/sec
FEF 25-75 Pre: 1.92 L/sec
FEF2575-%Change-Post: 41 %
FEF2575-%Pred-Post: 83 %
FEF2575-%Pred-Pre: 59 %
FEV1-%Change-Post: 11 %
FEV1-%Pred-Post: 79 %
FEV1-%Pred-Pre: 71 %
FEV1-Post: 2.65 L
FEV1-Pre: 2.38 L
FEV1FVC-%Change-Post: 7 %
FEV1FVC-%Pred-Pre: 90 %
FEV6-%Change-Post: 3 %
FEV6-%Pred-Post: 82 %
FEV6-%Pred-Pre: 79 %
FEV6-Post: 3.34 L
FEV6-Pre: 3.24 L
FEV6FVC-%Pred-Post: 102 %
FEV6FVC-%Pred-Pre: 102 %
FVC-%Change-Post: 3 %
FVC-%Pred-Post: 80 %
FVC-%Pred-Pre: 78 %
FVC-Post: 3.35 L
FVC-Pre: 3.24 L
Post FEV1/FVC ratio: 79 %
Post FEV6/FVC ratio: 100 %
Pre FEV1/FVC ratio: 74 %
Pre FEV6/FVC Ratio: 100 %
RV % pred: 117 %
RV: 2.19 L
TLC % pred: 95 %
TLC: 5.42 L

## 2021-04-15 MED ORDER — BUDESONIDE-FORMOTEROL FUMARATE 160-4.5 MCG/ACT IN AERO: 2.0000 | INHALATION_SPRAY | Freq: Two times a day (BID) | RESPIRATORY_TRACT | 5 refills | Status: DC

## 2021-04-15 NOTE — Progress Notes (Signed)
Full PFT completed today ? ?

## 2021-04-15 NOTE — Progress Notes (Signed)
Subjective:   PATIENT ID: Faith Willis GENDER: female DOB: 1975/04/08, MRN: [id number]   HPI  Chief Complaint  Patient presents with   Consult    Dry cough   Reason for Visit: Follow-up, new patient sarcoid  [name] is a 46 year old female never smoker with sarcoid, allergic rhinitis and reflux who presents as a new patient to me.  Prior EMR notes reviewed. She was previously followed by Dr. Chase Caller at [facility]. Prior to this she was seen at [facility] in [place] from 2020-2021 for calcified pulmonary nodules and mediastinal adenopathy. She was asymptomatic and had normal PFTs. Underwent EBUS 02/08/2019 that was nondiagnostic. In 02/27/2019, she was diagnosed with COVID. Had worsening symptoms and CTA 03/19/19 demonstrated progression of mediastinal and hilar adenopathy and bronchus intermedius obliteration. 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 She was started on steroid taper in 2021. Was on low-dose prednisone in April and off bactrim. Never been on biologics. She moved to [facility] in summer 2021 and established care with [facility] in March 2022.  Since her last visit with Dr. Chase Caller, she was started on Arunity. Improvement on Arnuity by 50%. Still have episodes of cough but not as severe. Cough occurs with rest and not with activity. No shortness of breath or wheezing. Denies chest pain. No LE swelling.    Social History: Never smoker  I have personally reviewed patient's past medical/family/social history, allergies, current medications.  Past Medical History:  Diagnosis Date   Acid reflux    Allergic rhinitis    Arthritis    Sarcoid     Family History  Problem Relation Age of Onset   Diabetes Other      Social History   Occupational History   Not on file  Tobacco Use   Smoking status: Never   Smokeless tobacco: Never  Substance and Sexual Activity   Alcohol use: Not on file    Drug use: Not on file   Sexual activity: Not on file    Allergies  Allergen Reactions   Sulfa Antibiotics Hives and Rash   Cefuroxime Diarrhea and Nausea And Vomiting    diarrhea diarrhea diarrhea diarrhea    Hydrocodone-Acetaminophen     Other reaction(s): Dizziness Passed out Passed out Passed out Passed out      Outpatient Medications Prior to Visit  Medication Sig Dispense Refill   albuterol (VENTOLIN HFA) 108 (90 Base) MCG/ACT inhaler Inhale 2 puffs into the lungs every 6 (six) hours as needed for wheezing or shortness of breath. 18 g 3   ascorbic acid (VITAMIN C) 100 MG tablet Take by mouth.     azithromycin (ZITHROMAX) 250 MG tablet Take by mouth daily.     calcium-vitamin D 250-100 MG-UNIT tablet Take 1 tablet by mouth 2 (two) times daily.     cetirizine (ZYRTEC) 10 MG tablet Take by mouth.     desogestrel-ethinyl estradiol (MIRCETTE) 0.15-0.02/0.01 MG (21/5) tablet Take 1 tablet by mouth daily.     diphenhydrAMINE (BENADRYL) 25 mg capsule Take by mouth.     Fluticasone Furoate (ARNUITY ELLIPTA) 100 MCG/ACT AEPB Inhale 1 puff into the lungs daily. 30 each 5   omeprazole (PRILOSEC) 40 MG capsule Take by mouth.     No facility-administered medications prior to visit.    Review of Systems  Constitutional:  Negative for chills, diaphoresis, fever, malaise/fatigue and weight loss.  HENT:  Negative for congestion, ear pain and sore throat.   Respiratory:  Positive  for cough. Negative for hemoptysis, sputum production, shortness of breath and wheezing.   Cardiovascular:  Negative for chest pain, palpitations and leg swelling.  Gastrointestinal:  Negative for abdominal pain, heartburn and nausea.  Genitourinary:  Negative for frequency.  Musculoskeletal:  Negative for joint pain and myalgias.  Skin:  Negative for itching and rash.  Neurological:  Negative for dizziness, weakness and headaches.  Endo/Heme/Allergies:  Does not bruise/bleed easily.   Psychiatric/Behavioral:  Negative for depression. The patient is not nervous/anxious.     Objective:   Vitals:   04/15/21 1609  BP: 122/68  Pulse: 82  Temp: 98.5 F (36.9 C)  SpO2: 98%  Weight: 195 lb (88.5 kg)  Height: '5\' 8"'  (1.727 m)      Physical Exam: General: Well-appearing, no acute distress HENT: [place], AT Eyes: EOMI, no scleral icterus Respiratory: Clear to auscultation bilaterally.  No crackles, wheezing or rales Cardiovascular: RRR, -M/R/G, no JVD Extremities:-Edema,-tenderness Neuro: AAO x4, CNII-XII grossly intact Psych: Normal mood, normal affect  Data Reviewed:  Imaging: CT Chest 07/17/20 Mediastinal and hilar parenchymal findings of sarcoid. RML bronchus narrowing. Peripheral right lower lobe nodule measuring 82m  PFT: 07/17/20 FVC 3.45 (82%) FEV1 2.8 (83%) Ratio 76  TLC 92% DLCO 112% Interpretation: Normal PFTs. No significant BD response  04/15/20 FVC 3.35 (80%) FEV1 2.65 (79%) Ratio 74  TLC 95% DLCO 96% Interpretation: Normal PFTs. No significant change in FVC and FEV1. No significant BD response.  Labs: CBC No results found for: WBC, RBC, HGB, HCT, PLT, MCV, MCH, MCHC, RDW, LYMPHSABS, MONOABS, EOSABS, BASOSABS BMET No results found for: NA, K, CL, CO2, GLUCOSE, BUN, CREATININE, CALCIUM, EGFR, GFRNONAA  EKG 04/05/19 - Incomplete RBBB per report    Assessment & Plan:   Discussion: 46year old female never smoker with sarcoid, allergic rhinitis and reflux who presents as a new patient for mediastinal sarcoidosis.  We discussed the clinical course of sarcoid and management including serial PFTs, labs, eye exam, and EKG and chest imaging if indicated. If symptoms suggest sarcoid flare in the future, we would manage with steroids +/- biologics.  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 Previously completed taper in 2021 - Annual PFTs.  Due 03/2022 or sooner if symptoms persist/worsen -  Recommend annual ophthalmology exam.  Last visit on 2021 - ORDER echocardiogram. Obtain EKG at next visit  Chronic cough --STOP Arnuity --START Symbicort 160-4.5 mcg TWO puffs TWICE a day --CONTINUE Albuterol as needed for shortness of breath, wheezing and cough  RLL pulmonary nodule measuring 8 cm --ORDER CT Chest Chest without contrast  Health Maintenance Immunization History  Administered Date(s) Administered   Influenza,inj,Quad PF,6+ Mos 12/31/2016, 12/21/2020   Moderna Sars-Covid-2 Vaccination 05/20/2019, 12/14/2019   Tdap 07/08/2016   CT Lung Screen- not qualified. Never smoker  Orders Placed This Encounter  Procedures   CT Chest Wo Contrast    Standing Status:   Future    Standing Expiration Date:   04/15/2022    Scheduling Instructions:     1 month with CT prior to visit    Order Specific Question:   Is patient pregnant?    Answer:   No    Order Specific Question:   [facility]?    [facility]    Standing Status:   Future    Standing Expiration Date:   05/16/2021    Scheduling Instructions:     Please  schedule 1 month CT and echo on same day prior to visit    Order Specific Question:   Where should this test be performed    Answer:   [facility]    Order Specific Question:   Perflutren DEFINITY (image enhancing agent) should be administered unless hypersensitivity or allergy exist    Answer:   Administer Perflutren    Order Specific Question:   Is a special reader required? (athlete or structural heart)    Answer:   No    Order Specific Question:   Reason for exam-Echo    Answer:   Cardiac Sarcoidosis D86.85    Order Specific Question:   Other Comments    Answer:   sarcoid   Meds ordered this encounter  Medications   budesonide-formoterol (SYMBICORT) 160-4.5 MCG/ACT inhaler    Sig: Inhale 2 puffs into the lungs in the morning and at bedtime.    Dispense:  1 each    Refill:  5   Return in about 7  weeks (around 05/31/2021).  I have spent a total time of 45-minutes on the day of the appointment reviewing prior documentation, coordinating care and discussing medical diagnosis and plan with the patient/family. Imaging, labs and tests included in this note have been reviewed and interpreted independently by me.  [place], MD [facility] 04/15/2021 3:54 PM  Office Number [phone]

## 2021-04-15 NOTE — Patient Instructions (Addendum)
Sarcoid  - ORDER echocardiogram. Obtain EKG at next visit - Recommend annual ophthalmology exam  Chronic cough --STOP Arnuity --START Symbicort 160-4.5 mcg TWO puffs TWICE a day --CONTINUE Albuterol as needed for shortness of breath, wheezing and cough  RLL pulmonary nodule measuring 8 cm --ORDER CT Chest Chest without contrast  Follow-up with me in 1 month with CT prior to visit

## 2021-04-21 ENCOUNTER — Encounter: Payer: Self-pay | Admitting: Pulmonary Disease

## 2021-04-21 DIAGNOSIS — R053 Chronic cough: Secondary | ICD-10-CM | POA: Insufficient documentation

## 2021-05-02 ENCOUNTER — Encounter: Payer: Self-pay | Admitting: Pulmonary Disease

## 2021-05-02 DIAGNOSIS — R911 Solitary pulmonary nodule: Secondary | ICD-10-CM

## 2021-05-02 DIAGNOSIS — D862 Sarcoidosis of lung with sarcoidosis of lymph nodes: Secondary | ICD-10-CM

## 2021-05-02 DIAGNOSIS — R053 Chronic cough: Secondary | ICD-10-CM

## 2021-05-02 NOTE — Telephone Encounter (Signed)
Dr. Everardo All, please see mychart message sent by pt.

## 2021-05-02 NOTE — Telephone Encounter (Signed)
Ok to order. She will also need baseline labs with our office anyway. So please also order a CBC with diff, CMET and quantiferon-TB.

## 2021-05-09 ENCOUNTER — Other Ambulatory Visit: Payer: Self-pay | Admitting: Pulmonary Disease

## 2021-05-14 ENCOUNTER — Ambulatory Visit (HOSPITAL_BASED_OUTPATIENT_CLINIC_OR_DEPARTMENT_OTHER)
Admission: RE | Admit: 2021-05-14 | Discharge: 2021-05-14 | Disposition: A | Discharge status: Home / Self Care | Payer: BC Managed Care – PPO | Source: Ambulatory Visit | Attending: Pulmonary Disease | Admitting: Pulmonary Disease

## 2021-05-14 ENCOUNTER — Ambulatory Visit (HOSPITAL_COMMUNITY)
Admission: RE | Admit: 2021-05-14 | Discharge: 2021-05-14 | Disposition: A | Discharge status: Home / Self Care | Payer: BC Managed Care – PPO | Source: Ambulatory Visit | Attending: Pulmonary Disease | Admitting: Pulmonary Disease

## 2021-05-14 ENCOUNTER — Other Ambulatory Visit: Payer: Self-pay

## 2021-05-14 DIAGNOSIS — D862 Sarcoidosis of lung with sarcoidosis of lymph nodes: Secondary | ICD-10-CM | POA: Diagnosis present

## 2021-05-14 DIAGNOSIS — D8685 Sarcoid myocarditis: Secondary | ICD-10-CM | POA: Diagnosis not present

## 2021-05-14 DIAGNOSIS — R911 Solitary pulmonary nodule: Secondary | ICD-10-CM | POA: Diagnosis not present

## 2021-05-14 LAB — ECHOCARDIOGRAM COMPLETE
Area-P 1/2: 5.4 cm2
Calc EF: 57.3 %
S' Lateral: 3.3 cm
Single Plane A2C EF: 60.4 %
Single Plane A4C EF: 53.8 %

## 2021-05-14 NOTE — Progress Notes (Signed)
°  Echocardiogram 2D Echocardiogram has been performed.  Faith Willis 05/14/2021, 10:50 AM

## 2021-05-15 ENCOUNTER — Telehealth: Payer: Self-pay | Admitting: Pulmonary Disease

## 2021-05-15 LAB — CBC WITH DIFFERENTIAL/PLATELET
Basophils Absolute: 0 10*3/uL (ref 0.0–0.2)
Basos: 0 %
EOS (ABSOLUTE): 0.2 10*3/uL (ref 0.0–0.4)
Eos: 2 %
Hematocrit: 44.4 % (ref 34.0–46.6)
Hemoglobin: 14.3 g/dL (ref 11.1–15.9)
Immature Grans (Abs): 0 10*3/uL (ref 0.0–0.1)
Immature Granulocytes: 0 %
Lymphocytes Absolute: 1.4 10*3/uL (ref 0.7–3.1)
Lymphs: 22 %
MCH: 29.2 pg (ref 26.6–33.0)
MCHC: 32.2 g/dL (ref 31.5–35.7)
MCV: 91 fL (ref 79–97)
Monocytes Absolute: 0.4 10*3/uL (ref 0.1–0.9)
Monocytes: 6 %
Neutrophils Absolute: 4.3 10*3/uL (ref 1.4–7.0)
Neutrophils: 70 %
Platelets: 322 10*3/uL (ref 150–450)
RBC: 4.9 x10E6/uL (ref 3.77–5.28)
RDW: 13.2 % (ref 11.7–15.4)
WBC: 6.2 10*3/uL (ref 3.4–10.8)

## 2021-05-15 LAB — HBSAG QUANTITATIVE, MONITOR

## 2021-05-15 LAB — SPECIMEN STATUS REPORT

## 2021-05-15 LAB — QUANTIFERON-TB GOLD PLUS
QuantiFERON Mitogen Value: >10 IU/mL
QuantiFERON Nil Value: 0.03 IU/mL
QuantiFERON TB1 Ag Value: 0.03 IU/mL
QuantiFERON TB2 Ag Value: 0.02 IU/mL
QuantiFERON-TB Gold Plus: NEGATIVE

## 2021-05-15 LAB — COMPREHENSIVE METABOLIC PANEL
ALT: 18 IU/L (ref 0–32)
AST: 16 IU/L (ref 0–40)
Albumin/Globulin Ratio: 1.2 (ref 1.2–2.2)
Albumin: 3.7 g/dL — ABNORMAL LOW (ref 3.8–4.8)
Alkaline Phosphatase: 80 IU/L (ref 44–121)
BUN/Creatinine Ratio: 20 (ref 9–23)
BUN: 16 mg/dL (ref 6–24)
Bilirubin Total: 0.4 mg/dL (ref 0.0–1.2)
CO2: 24 mmol/L (ref 20–29)
Calcium: 8.9 mg/dL (ref 8.7–10.2)
Chloride: 102 mmol/L (ref 96–106)
Creatinine, Ser: 0.81 mg/dL (ref 0.57–1.00)
Globulin, Total: 3.1 g/dL (ref 1.5–4.5)
Glucose: 121 mg/dL — ABNORMAL HIGH (ref 70–99)
Potassium: 4.4 mmol/L (ref 3.5–5.2)
Sodium: 140 mmol/L (ref 134–144)
Total Protein: 6.8 g/dL (ref 6.0–8.5)
eGFR: 92 mL/min/{1.73_m2} (ref >59)

## 2021-05-18 NOTE — Telephone Encounter (Signed)
Error

## 2021-05-19 ENCOUNTER — Telehealth: Payer: Self-pay | Admitting: Pulmonary Disease

## 2021-05-19 NOTE — Telephone Encounter (Signed)
Feels like Symbicort is causing high blood pressure.  She does not have a history of hypertension. She has an appointment coming up on 3/10. Advised her to stop Symbicort for now, take blood pressure readings and then rechallenge and see if blood pressure goes back up.  She can share those readings with Dr. Everardo All on her follow-up visit`

## 2021-05-19 NOTE — Telephone Encounter (Signed)
Noted. Will discuss BP and inhalers at next visit.

## 2021-05-23 ENCOUNTER — Telehealth: Payer: Self-pay | Admitting: Pulmonary Disease

## 2021-05-23 NOTE — Telephone Encounter (Signed)
Ok to stop symbicort ?

## 2021-05-23 NOTE — Telephone Encounter (Signed)
Called and left voicemail for patient to call office back  

## 2021-05-23 NOTE — Telephone Encounter (Signed)
Spoke with pt and reviewed Dr. Golden Pop recommendations. Pt stated understanding. Nothing further needed at this time.  ? ?Routing to Dr. Loanne Drilling as Faith Willis  ?

## 2021-05-23 NOTE — Telephone Encounter (Signed)
Spoke with pt who states stopping Symbicort on this Saturday and B/P reading are still high. Pt has recorded all reading with one example being 150/110. Pt states she will My Chart picture of readings when she gets home this evening. Dr. Marchelle Gearing please advise as Dr. Everardo All is not available.  ?

## 2021-05-30 ENCOUNTER — Other Ambulatory Visit: Payer: Self-pay

## 2021-05-30 DIAGNOSIS — D862 Sarcoidosis of lung with sarcoidosis of lymph nodes: Secondary | ICD-10-CM

## 2021-05-30 NOTE — Progress Notes (Signed)
ekg 

## 2021-05-31 ENCOUNTER — Ambulatory Visit (INDEPENDENT_AMBULATORY_CARE_PROVIDER_SITE_OTHER): Payer: BC Managed Care – PPO | Admitting: Pulmonary Disease

## 2021-05-31 ENCOUNTER — Other Ambulatory Visit: Payer: Self-pay

## 2021-05-31 ENCOUNTER — Encounter: Payer: Self-pay | Admitting: Pulmonary Disease

## 2021-05-31 VITALS — BP 144/60 | HR 100 | Ht 68.0 in | Wt 198.4 lb

## 2021-05-31 DIAGNOSIS — R053 Chronic cough: Secondary | ICD-10-CM

## 2021-05-31 DIAGNOSIS — D860 Sarcoidosis of lung: Secondary | ICD-10-CM

## 2021-05-31 MED ORDER — SPIRIVA RESPIMAT 2.5 MCG/ACT IN AERS: 2.0000 | INHALATION_SPRAY | Freq: Every day | RESPIRATORY_TRACT | 0 refills | Status: DC

## 2021-05-31 MED ORDER — SPIRIVA RESPIMAT 2.5 MCG/ACT IN AERS
2.0000 | INHALATION_SPRAY | Freq: Every day | RESPIRATORY_TRACT | 2 refills | Status: DC
Start: 2021-05-31 — End: 2021-12-13

## 2021-05-31 NOTE — Progress Notes (Signed)
Patient seen in the office today and instructed on use of Spiriva 2.5.  Patient expressed understanding and demonstrated technique. 

## 2021-05-31 NOTE — Progress Notes (Signed)
Subjective:   PATIENT ID: Faith Willis GENDER: female DOB: 01-09-1976, MRN: [id number]   HPI  Chief Complaint  Patient presents with   Follow-up    sarcoid   Reason for Visit: Follow-up  [name] is a 46 year old female never smoker with sarcoid, allergic rhinitis and reflux who presents for follow-up.  Synopsis: She was previously followed by Dr. Chase Caller at [facility]. Prior to this she was seen at [facility] in [place] from 2020-2021 for calcified pulmonary nodules and mediastinal adenopathy. She was asymptomatic and had normal PFTs. Underwent EBUS 02/08/2019 that was nondiagnostic. In 02/27/2019, she was diagnosed with COVID. Had worsening symptoms and CTA 03/19/19 demonstrated progression of mediastinal and hilar adenopathy and bronchus intermedius obliteration. 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 She was started on steroid taper in 2021. Was on low-dose prednisone in April and off bactrim. Never been on biologics. She moved to [facility] in summer 2021 and established care with [facility] in March 2022.  04/15/21 Since her last visit with Dr. Chase Caller, she was started on Arunity. Improvement on Arnuity by 50%. Still have episodes of cough but not as severe. Cough occurs with rest and not with activity. No shortness of breath or wheezing. Denies chest pain. No LE swelling.    05/31/21 Since her last visit, she was started on Symbicort for cough however this was discontinued due to reported elevated blood pressure readings.  After stopping Symbicort for 4 days she called our office to report continued elevated blood pressure readings.  She also had complaints of chest heaviness that prompted her to visit the ED on 05/25/2021.  ED note reviewed and demonstrated negative work-up with troponin less than 3 and normal CBC and c-Met.  Chest x-ray with no acute abnormalities, noted nodular bilateral perihilar region.  EKG  with heart rate 83 and no signs for ischemia per report. Since stopping Symbicort her cough which was previously resolved has now returned. She continues to have hypertension with SBP in the 150s despite stopping the Symbicort >10 days ago. She reports family hx significant for hypertension.  Social History: Never smoker  Past Medical History:  Diagnosis Date   Acid reflux    Allergic rhinitis    Arthritis    Sarcoid     Family History  Problem Relation Age of Onset   Diabetes Other      Social History   Occupational History   Not on file  Tobacco Use   Smoking status: Never   Smokeless tobacco: Never  Substance and Sexual Activity   Alcohol use: Not on file   Drug use: Not on file   Sexual activity: Not on file    Allergies  Allergen Reactions   Sulfa Antibiotics Hives and Rash   Cefuroxime Diarrhea and Nausea And Vomiting    diarrhea diarrhea diarrhea diarrhea    Hydrocodone-Acetaminophen     Other reaction(s): Dizziness Passed out Passed out Passed out Passed out      Outpatient Medications Prior to Visit  Medication Sig Dispense Refill   albuterol (VENTOLIN HFA) 108 (90 Base) MCG/ACT inhaler Inhale 2 puffs into the lungs every 6 (six) hours as needed for wheezing or shortness of breath. 18 g 3   ascorbic acid (VITAMIN C) 100 MG tablet Take by mouth.     calcium-vitamin D 250-100 MG-UNIT tablet Take 1 tablet by mouth 2 (two) times daily.     cetirizine (ZYRTEC) 10 MG tablet Take by mouth.  desogestrel-ethinyl estradiol (MIRCETTE) 0.15-0.02/0.01 MG (21/5) tablet Take 1 tablet by mouth daily.     diphenhydrAMINE (BENADRYL) 25 mg capsule Take by mouth.     meloxicam (MOBIC) 15 MG tablet Take 15 mg by mouth daily.     omeprazole (PRILOSEC) 40 MG capsule Take by mouth.     budesonide-formoterol (SYMBICORT) 160-4.5 MCG/ACT inhaler Inhale 2 puffs into the lungs in the morning and at bedtime. (Patient not taking: Reported on 05/31/2021) 1 each 5   No  facility-administered medications prior to visit.    Review of Systems  Constitutional:  Negative for chills, diaphoresis, fever, malaise/fatigue and weight loss.  HENT:  Negative for congestion.   Respiratory:  Positive for cough. Negative for hemoptysis, sputum production, shortness of breath and wheezing.   Cardiovascular:  Negative for chest pain, palpitations and leg swelling.    Objective:   Vitals:   05/31/21 0856  BP: (!) 144/60  Pulse: 100  SpO2: 96%  Weight: 198 lb 6.4 oz (90 kg)  Height: '5\' 8"'  (1.727 m)   SpO2: 96 % O2 Device: None (Room air)  Physical Exam: General: Well-appearing, no acute distress HENT: [place], AT Eyes: EOMI, no scleral icterus Respiratory: Clear to auscultation bilaterally.  No crackles, wheezing or rales Cardiovascular: RRR, -M/R/G, no JVD Extremities:-Edema,-tenderness Neuro: AAO x4, CNII-XII grossly intact Psych: Normal mood, normal affect  Data Reviewed:  Imaging: CT Chest 07/17/20 Mediastinal and hilar parenchymal findings of sarcoid. RML bronchus narrowing. Peripheral right lower lobe nodule measuring 58m CT chest 05/14/2021-mildly enlarged bilateral hilar and mediastinal nodes.  Right lower lobe nodule 136mand 8 mm, previously 9 and 8 respectively.  Perihilar nodule in the right middle lobe enlarged from 9 to 1.3 cm.  PFT: 07/17/20 FVC 3.45 (82%) FEV1 2.8 (83%) Ratio 76  TLC 92% DLCO 112% Interpretation: Normal PFTs. No significant BD response  04/15/20 FVC 3.35 (80%) FEV1 2.65 (79%) Ratio 74  TLC 95% DLCO 96% Interpretation: Normal PFTs. No significant change in FVC and FEV1. No significant BD response.  Labs: CBC    Component Value Date/Time   WBC 6.2 05/09/2021 1638   RBC 4.90 05/09/2021 1638   HGB 14.3 05/09/2021 1638   HCT 44.4 05/09/2021 1638   PLT 322 05/09/2021 1638   MCV 91 05/09/2021 1638   MCH 29.2 05/09/2021 1638   MCHC 32.2 05/09/2021 1638   RDW 13.2 05/09/2021 1638   LYMPHSABS 1.4 05/09/2021 1638   EOSABS 0.2  05/09/2021 1638   BASOSABS 0.0 05/09/2021 1638   BMET    Component Value Date/Time   NA 140 05/09/2021 1638   K 4.4 05/09/2021 1638   CL 102 05/09/2021 1638   CO2 24 05/09/2021 1638   GLUCOSE 121 (H) 05/09/2021 1638   BUN 16 05/09/2021 1638   CREATININE 0.81 05/09/2021 1638   CALCIUM 8.9 05/09/2021 1638   EGFR 92 05/09/2021 1638   EKG/Echo: EKG 04/05/19 - Incomplete RBBB per report Echo - 05/14/2021 - EF 60 to 65%.  No WMA.  Normal diastolic.  Normal RV size and function.  No valvular abnormalities EKG 05/24/2021 - normal sinus rhythm.  Heart rate 83 PR interval 166 QTc 455    Assessment & Plan:   Discussion: [age]ear old female never smoker with sarcoid, allergic rhinitis and reflux who presents for follow-up.  She has had recent work-up for chronic cough with normal PFTs however CT concerning for mild enlargement of hilar adenopathy and right pulmonary nodules with the largest in the right middle lobe growing from  9 mm to 13 mm.  Discussed surveillance imaging versus initiation of treatment with steroids for possible sarcoid flare.  Cannot rule out possible malignancy.  Recommended treatment with follow-up imaging determine if bronchoscopy is indicated however after discussion, patient preferred bronchodilator treatment for now with plan to repeat scan. Will avoid ICS inhaler for now and trial LAMA. If this is insufficient, we can re-trial ICS/LABA when her blood pressure is better controlled.  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 Previously completed taper in 2021 - Annual PFTs.  Due 03/2022 or sooner if symptoms persist/worsen - Recommend annual ophthalmology exam.  Last visit on 2021 - Reviewed echo and EKG: Normal  Chronic cough --Previously tolerated Arnuity with partial improvement --Discontinue Symbicort: Possible ADE with hypertension --START Spiriva 2.5 mcg TWO puffs ONCE a day. Sample and Rx given --CONTINUE  Albuterol as needed for shortness of breath, wheezing and cough  RLL pulmonary nodule measuring 8 cm Increased perilymphatic  --ORDER CT Chest Chest without contrast in 6 months (September)  Hypertension --Doubt this is related to Symbicort at this point. >2 week washout period --Offered to start anti-hypertensive. Patient will wait to discuss with PCP  Health Maintenance Immunization History  Administered Date(s) Administered   Influenza, Seasonal, Injecte, Preservative Fre 12/31/2016, 12/21/2020   Influenza,inj,Quad PF,6+ Mos 12/31/2016, 12/21/2020   Influenza-Unspecified 12/31/2016, 11/23/2019, 11/23/2019, 12/21/2020   Moderna Sars-Covid-2 Vaccination 04/22/2019, 04/22/2019, 05/20/2019, 11/23/2019, 11/23/2019, 12/14/2019   Tdap 07/08/2016   CT Lung Screen- not qualified. Never smoker  Orders Placed This Encounter  Procedures   CT Chest Wo Contrast    Standing Status:   Future    Standing Expiration Date:   06/01/2022    Scheduling Instructions:     CT Chest Chest without contrast in 6 months (September)    Order Specific Question:   Is patient pregnant?    Answer:   No    Order Specific Question:   Preferred imaging location?    Answer:   [facility] ordered this encounter  Medications   Tiotropium Bromide Monohydrate (SPIRIVA RESPIMAT) 2.5 MCG/ACT AERS    Sig: Inhale 2 puffs into the lungs daily.    Dispense:  4 g    Refill:  2   Tiotropium Bromide Monohydrate (SPIRIVA RESPIMAT) 2.5 MCG/ACT AERS    Sig: Inhale 2 puffs into the lungs daily.    Dispense:  4 g    Refill:  0   Return in about 6 months (around 12/01/2021).  I have spent a total time of 45-minutes on the day of the appointment reviewing prior documentation, coordinating care and discussing medical diagnosis and plan with the patient/family. Imaging, labs and tests included in this note have been reviewed and interpreted independently by me.  [place], MD [facility] 05/31/2021 10:45 AM  Office Number [phone]

## 2021-05-31 NOTE — Patient Instructions (Addendum)
Pulmonary sarcoidosis ?RLL pulmonary nodule measuring 8 cm ?--ORDER CT Chest Chest without contrast in 6 months (September) ? ?Chronic cough ?--START Spiriva 2.5 mcg TWO puffs ONCE a day ?--CONTINUE Albuterol as needed for shortness of breath, wheezing and cough ? ?Follow-up with me in 6 months ?

## 2021-10-07 ENCOUNTER — Encounter: Payer: Self-pay | Admitting: Pulmonary Disease

## 2021-10-07 ENCOUNTER — Other Ambulatory Visit: Payer: Self-pay | Admitting: Internal Medicine

## 2021-10-07 MED ORDER — ARNUITY ELLIPTA 200 MCG/ACT IN AEPB
1.0000 | INHALATION_SPRAY | Freq: Every day | RESPIRATORY_TRACT | 5 refills | Status: DC
Start: 2021-10-07 — End: 2021-10-15

## 2021-10-09 NOTE — Telephone Encounter (Signed)
Dr. Everardo All, please advise on pt's latest email. Thanks.

## 2021-10-15 MED ORDER — ARNUITY ELLIPTA 200 MCG/ACT IN AEPB: 1.0000 | INHALATION_SPRAY | Freq: Every day | RESPIRATORY_TRACT | 5 refills | Status: DC

## 2021-10-15 NOTE — Addendum Note (Signed)
Addended by: Sheran Luz on: 10/15/2021 11:48 AM   Modules accepted: Orders

## 2021-10-15 NOTE — Telephone Encounter (Signed)
[facility] in [place] and cancelled Arnuity. Pharmacy was removed from chart. Arnuity has been resent to pt's preferred pharmacy.

## 2021-12-06 ENCOUNTER — Encounter (HOSPITAL_COMMUNITY): Payer: Self-pay

## 2021-12-06 ENCOUNTER — Ambulatory Visit (HOSPITAL_COMMUNITY)
Admission: RE | Admit: 2021-12-06 | Discharge: 2021-12-06 | Disposition: A | Discharge status: Home / Self Care | Payer: BC Managed Care – PPO | Source: Ambulatory Visit | Attending: Pulmonary Disease | Admitting: Pulmonary Disease

## 2021-12-06 DIAGNOSIS — R590 Localized enlarged lymph nodes: Secondary | ICD-10-CM | POA: Diagnosis not present

## 2021-12-06 DIAGNOSIS — R918 Other nonspecific abnormal finding of lung field: Secondary | ICD-10-CM | POA: Diagnosis not present

## 2021-12-06 DIAGNOSIS — D860 Sarcoidosis of lung: Secondary | ICD-10-CM

## 2021-12-13 ENCOUNTER — Encounter: Payer: Self-pay | Admitting: Pulmonary Disease

## 2021-12-13 ENCOUNTER — Ambulatory Visit (INDEPENDENT_AMBULATORY_CARE_PROVIDER_SITE_OTHER): Payer: BC Managed Care – PPO | Admitting: Pulmonary Disease

## 2021-12-13 VITALS — BP 116/70 | HR 101 | Ht 68.0 in | Wt 207.0 lb

## 2021-12-13 DIAGNOSIS — Z23 Encounter for immunization: Secondary | ICD-10-CM | POA: Diagnosis not present

## 2021-12-13 DIAGNOSIS — D869 Sarcoidosis, unspecified: Secondary | ICD-10-CM | POA: Diagnosis not present

## 2021-12-13 DIAGNOSIS — Z79899 Other long term (current) drug therapy: Secondary | ICD-10-CM

## 2021-12-13 MED ORDER — ALBUTEROL SULFATE HFA 108 (90 BASE) MCG/ACT IN AERS: 2.0000 | INHALATION_SPRAY | Freq: Four times a day (QID) | RESPIRATORY_TRACT | 1 refills | Status: AC | PRN

## 2021-12-13 MED ORDER — PREDNISONE 20 MG PO TABS: 20.0000 mg | ORAL_TABLET | Freq: Every day | ORAL | 0 refills | Status: DC

## 2021-12-13 NOTE — Patient Instructions (Addendum)
Pulmonary sarcoid maintenance - ORDER PFT Dec 2023 - Recommend annual ophthalmology exam.  Last visit on 2021. REFER to optho - Reviewed echo 04/2021 and EKG: Normal  Chronic cough - improved --CONTINUE Arnuity ONE puff daily --Failed Symbicort and Spiriva: Possible ADE with hypertension --CONTINUE Albuterol as needed for shortness of breath, wheezing and cough  High risk medication monitoring --Reviewed CT Chest with findings suggestive of sarcoid flare --START prednisone 20 mg daily x 3 months --ORDER CT Chest without contrast in December 2023 --Continue omeprazole 20 mg daily for ulcer prevention with steroids  Osteoporosis prevention in setting of chronic steroid use If you are taking >2.5 mg of prednisone for >3 month regardless of risk fracture or have moderate-high risk fracture: --Recommend lifestyple modifications (e.g. balanced diet, smoking cessation, moderate alcohol intake and regular weight bearing or resistance exercises) --Recommend calcium 1000 mg and and vitamin D 800U  --Consider BMD surveillance after 3 months of steroids  Follow-up with me in 3 months

## 2021-12-13 NOTE — Progress Notes (Unsigned)
Subjective:   PATIENT ID: Faith Willis GENDER: female DOB: 1975-12-03, MRN: [id number]   HPI  Chief Complaint  Patient presents with   Follow-up    CT results   Reason for Visit: Follow-up  Faith Willis is a 46 year old female never smoker with sarcoid, allergic rhinitis and reflux who presents for follow-up.  Synopsis: She was previously followed by Dr. Chase Caller at [facility]. Prior to this she was seen at [facility] in [place] from 2020-2021 for calcified pulmonary nodules and mediastinal adenopathy. She was asymptomatic and had normal PFTs. Underwent EBUS 02/08/2019 that was nondiagnostic. In 02/27/2019, she was diagnosed with COVID. Had worsening symptoms and CTA 03/19/19 demonstrated progression of mediastinal and hilar adenopathy and bronchus intermedius obliteration. 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 She was started on steroid taper in 2021. Was on low-dose prednisone in April and off bactrim. Never been on biologics. She moved to [facility] in summer 2021 and established care with [facility] in March 2022.  04/15/21 Since her last visit with Dr. Chase Caller, she was started on Arunity. Improvement on Arnuity by 50%. Still have episodes of cough but not as severe. Cough occurs with rest and not with activity. No shortness of breath or wheezing. Denies chest pain. No LE swelling.    05/31/21 Since her last visit, she was started on Symbicort for cough however this was discontinued due to reported elevated blood pressure readings.  After stopping Symbicort for 4 days she called our office to report continued elevated blood pressure readings.  She also had complaints of chest heaviness that prompted her to visit the ED on 05/25/2021.  ED note reviewed and demonstrated negative work-up with troponin less than 3 and normal CBC and c-Met.  Chest x-ray with no acute abnormalities, noted nodular bilateral perihilar region.   EKG with heart rate 83 and no signs for ischemia per report. Since stopping Symbicort her cough which was previously resolved has now returned. She continues to have hypertension with SBP in the 150s despite stopping the Symbicort >10 days ago. She reports family hx significant for hypertension.  12/13/21 She is currently on Arnuity which improved her coughing and wheezing and chest heaviness. Did not tolerate Symbicort and Spiriva. Previously used to do boot camp. Currently in spin class and weight classes. Only needs albuterol with heavy exercise. She is trying to walk more. She has unchanged night sweats that soak her.  Prior inhalers: Symbicort - elevated BP Spiriva - ineffective   Social History: Never smoker  Past Medical History:  Diagnosis Date   Acid reflux    Allergic rhinitis    Arthritis    Sarcoid     Family History  Problem Relation Age of Onset   Diabetes Other      Social History   Occupational History   Not on file  Tobacco Use   Smoking status: Never   Smokeless tobacco: Never  Substance and Sexual Activity   Alcohol use: Not on file   Drug use: Not on file   Sexual activity: Not on file    Allergies  Allergen Reactions   Sulfa Antibiotics Hives and Rash   Cefuroxime Diarrhea and Nausea And Vomiting    diarrhea diarrhea diarrhea diarrhea    Hydrocodone-Acetaminophen     Other reaction(s): Dizziness Passed out Passed out Passed out Passed out      Outpatient Medications Prior to Visit  Medication Sig Dispense Refill   albuterol (VENTOLIN HFA) 108 (90  Base) MCG/ACT inhaler Inhale 2 puffs into the lungs every 6 (six) hours as needed for wheezing or shortness of breath. 18 g 3   ascorbic acid (VITAMIN C) 100 MG tablet Take by mouth.     calcium-vitamin D 250-100 MG-UNIT tablet Take 1 tablet by mouth 2 (two) times daily.     cetirizine (ZYRTEC) 10 MG tablet Take by mouth.     desogestrel-ethinyl estradiol (MIRCETTE) 0.15-0.02/0.01 MG (21/5)  tablet Take 1 tablet by mouth daily.     diphenhydrAMINE (BENADRYL) 25 mg capsule Take by mouth.     Fluticasone Furoate (ARNUITY ELLIPTA) 200 MCG/ACT AEPB Inhale 1 puff into the lungs daily. 30 each 5   meloxicam (MOBIC) 15 MG tablet Take 15 mg by mouth daily.     omeprazole (PRILOSEC) 40 MG capsule Take by mouth.     Tiotropium Bromide Monohydrate (SPIRIVA RESPIMAT) 2.5 MCG/ACT AERS Inhale 2 puffs into the lungs daily. 4 g 2   Tiotropium Bromide Monohydrate (SPIRIVA RESPIMAT) 2.5 MCG/ACT AERS Inhale 2 puffs into the lungs daily. 4 g 0   No facility-administered medications prior to visit.    Review of Systems  Constitutional:  Positive for diaphoresis. Negative for chills, fever, malaise/fatigue and weight loss.  HENT:  Negative for congestion.   Respiratory:  Positive for cough, shortness of breath and wheezing. Negative for hemoptysis and sputum production.   Cardiovascular:  Negative for chest pain, palpitations and leg swelling.     Objective:   Vitals:   12/13/21 0948  BP: 116/70  Pulse: (!) 101  SpO2: 97%  Weight: 93.9 kg  Height: '5\' 8"'  (1.727 m)      Physical Exam: General: Well-appearing, no acute distress HENT: [place], AT Eyes: EOMI, no scleral icterus Respiratory: Clear to auscultation bilaterally.  No crackles, wheezing or rales Cardiovascular: RRR, -M/R/G, no JVD Extremities:-Edema,-tenderness Neuro: AAO x4, CNII-XII grossly intact Psych: Normal mood, normal affect   Data Reviewed:  Imaging: CT Chest 07/17/20 Mediastinal and hilar parenchymal findings of sarcoid. RML bronchus narrowing. Peripheral right lower lobe nodule measuring 18m CT chest 05/14/2021-mildly enlarged bilateral hilar and mediastinal nodes.  Right lower lobe nodule 187mand 8 mm, previously 9 and 8 respectively.  Perihilar nodule in the right middle lobe enlarged from 9 to 1.3 cm. CT Chest 12/06/21 - Innumerable pulmonary nodules, increased in right upper and lower lung. Unchanged size in  nodules with largest 13 mm. Enlarged mediastinal and hilar lymph nodes  PFT: 07/17/20 FVC 3.45 (82%) FEV1 2.8 (83%) Ratio 76  TLC 92% DLCO 112% Interpretation: Normal PFTs. No significant BD response  04/15/20 FVC 3.35 (80%) FEV1 2.65 (79%) Ratio 74  TLC 95% DLCO 96% Interpretation: Normal PFTs. No significant change in FVC and FEV1. No significant BD response.  Labs: CBC    Component Value Date/Time   WBC 6.2 05/09/2021 1638   RBC 4.90 05/09/2021 1638   HGB 14.3 05/09/2021 1638   HCT 44.4 05/09/2021 1638   PLT 322 05/09/2021 1638   MCV 91 05/09/2021 1638   MCH 29.2 05/09/2021 1638   MCHC 32.2 05/09/2021 1638   RDW 13.2 05/09/2021 1638   LYMPHSABS 1.4 05/09/2021 1638   EOSABS 0.2 05/09/2021 1638   BASOSABS 0.0 05/09/2021 1638   BMET    Component Value Date/Time   NA 140 05/09/2021 1638   K 4.4 05/09/2021 1638   CL 102 05/09/2021 1638   CO2 24 05/09/2021 1638   GLUCOSE 121 (H) 05/09/2021 1638   BUN 16 05/09/2021 1638  CREATININE 0.81 05/09/2021 1638   CALCIUM 8.9 05/09/2021 1638   EGFR 92 05/09/2021 1638   EKG/Echo: EKG 04/05/19 - Incomplete RBBB per report Echo - 05/14/2021 - EF 60 to 65%.  No WMA.  Normal diastolic.  Normal RV size and function.  No valvular abnormalities EKG 05/24/2021 - normal sinus rhythm.  Heart rate 83 PR interval 166 QTc 455  Pathology: [facility] LAB AP CASE REPORT:Surgical Pathology                                Case: [id number]                                  Authorizing Provider:  Dalbert Batman, MD   Collected:           03/21/2019 1138              Ordering Location:     [place]:            03/21/2019 1159                                     Operating Room                                                              Pathologist:           Gean Maidens, MD                                                        Intraop:               Gean Maidens, MD                                                         Specimens:   A) - Lung, left lower lobe biopsy                                                                   B) - Lung, bronchus intermedius                                                                     C) - Lung, left upper  lobe bipsy                                                                   D) - Trachea, tracheal biopsy                                                                       E) - Mediastinum , proximal 4R                                                                     F) - Lung, bronchus intermediate                                                      [facility] LAB AP PRE-OPERATIVE DIAGNOSIS:Lymphadenopathy  [facility] AP PATHOLOGY PROCEDURES:Bronchoscopy, cervical mediastinoscopy, EBUS, EGD  Bronchoscopy w/EBUS  EGD  [facility] LAB AP FINAL DIAGNOSIS:  A. LEFT LOWER LOBE BIOPSY:   BRONCHIAL MUCOSA SHOWING GRANULATION TISSUE AND CHRONIC ACTIVE  INFLAMMATION.   B. BRONCHUS INTERMEDIUS, BIOPSY:   ULCERATED BRONCHIAL MUCOSA SHOWING GRANULATION TISSUE AND CHRONIC ACTIVE  INFLAMMATION.   NO ACID FAST OR FUNGAL ORGANISMS SEEN ON AFB, GMS, PAS AND MUCICARMINE  STAINS.     C.  LEFT UPPER LOBE BIOPSY:   BRONCHIAL MUCOSA SHOWING GRANULATION TISSUE AND CHRONIC ACTIVE  INFLAMMATION.   D.  TRACHEA, BIOPSY:   ULCERATED BRONCHIAL MUCOSA SHOWING GRANULATION TISSUE AND CHRONIC ACTIVE  INFLAMMATION.   E.  PROXIMAL 4R, EBUS-GUIDED BIOPSY:   LYMPH NODE TISSUE SHOWING CONFLUENT WELL FORMED EPITHELIOID GRANULOMAS,  FOCALLY NECROTIZING.   NO ACID FAST OR FUNGAL ORGANISMS SEEN ON AFB AND GMS STAINS.   NEGATIVE FOR TUMOR.   NOTE: THE APPEARANCE IS CONSISTENT WITH SARCOIDOSIS, HOWEVER, AN  INFECTIOUS PROCESS IS ALSO IN THE DIFFERENTIAL DIAGNOSIS.   F.  BRONCHUS INTERMEDIUS, BIOPSY:   ULCERATED BRONCHIAL MUCOSA SHOWING GRANULATION TISSUE AND CHRONIC ACTIVE  INFLAMMATION.   Note: The airway biopsies show no evidence of malignancy and no viral  cytopathic effect is  identified.     The case was referred to [facility] for  consultation.  Their report follows:   PATHOLOGIC DIAGNOSIS:  Consult slides from [facility], [place], [place]  [id number];  03/21/2019):   A.  Lung, left lower lobe, biopsy:    Bronchial mucosa with granulation tissue formation and admixed mixed  acute and chronic inflammation.   B. Lung, bronchus intermedius, frozen section biopsy:   Ulcerated bronchial mucosa with granulation tissue formation, admixed  mixed acute and chronic inflammation, fibrinopurulent exudate, and focus  of histiocytic aggregates.   Received stains for  AFB, GMS, PAS, and mucicarmine are negative.   C.  Lung, left upper lobe, biopsy:   Inflamed bronchial mucosa with granulation tissue formation and admixed  mixed acute and chronic inflammation.   D.  Trachea, biopsy:   Ulcerated bronchial mucosa with granulation tissue formation, admixed  mixed acute and chronic inflammation, fibrinopurulent exudate and focus of  histiocytic aggregates.   E.  Mediastinum, lymph node, proximal 4R, biopsy:   Lymph node with confluent well-formed, focally necrotizing granulomas with  hyalinization, see note.   Note:  Received stains for GMS and AFB are negative for micro-organisms.    The differential diagnosis includes sarcoidosis and an infectious process,  clinical correlation recommended.   F.  Lung, bronchus intermediate, biopsy:   Ulcerated bronchial mucosa with granulation tissue formation, admixed  mixed acute and chronic inflammation, and fibrinopurulent exudate.   Electronically signed out:  Dawson Bills, M.D.   Date:  04/01/2019  [facility] Department of Pathology     Assessment & Plan:   Discussion: 46 year old female never smoker with sarcoid, allergic rhinitis and reflux who presents for follow-up. She has had prior work-up for chronic cough with normal PFTs. CT reviewed with  progressive changes suggestive of sarcoid flare. Malignancy and infection possible but lower on differential. Will start treatment with prednisone with plan for CT follow-up.  Adverse effects of steroids discussed including bruising, weight gain, fluid retention, Cushingoid features, hypertension, cardiac arrhythmias, osteoporosis, gastric ulcers, increased risk of infection, insomnia and hyperglycemia.  We discussed the clinical course of sarcoid and management including serial PFTs, labs, eye exam, and EKG and chest imaging if indicated. Treatingsarcoid flare with steroids. If refractory, we would consider other immunosuppressants/biologics.  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 ORDER PFT Dec 2023 - Recommend annual ophthalmology exam.  Last visit on 2021. REFER to optho - Reviewed echo 04/2021 and EKG: Normal  Chronic cough - improved --CONTINUE Arnuity ONE puff daily --Failed Symbicort and Spiriva: Possible ADE with hypertension --CONTINUE Albuterol as needed for shortness of breath, wheezing and cough  High risk medication monitoring --Reviewed CT Chest with findings suggestive of sarcoid flare --START prednisone 20 mg daily x 3 months --ORDER CT Chest without contrast in December 2023 --Continue omeprazole 20 mg daily for ulcer prevention with steroids  Osteoporosis prevention in setting of chronic steroid use If you are taking >2.5 mg of prednisone for >3 month regardless of risk fracture or have moderate-high risk fracture: --Recommend lifestyple modifications (e.g. balanced diet, smoking cessation, moderate alcohol intake and regular weight bearing or resistance exercises) --Recommend calcium 1000 mg and and vitamin D 800U   Hypertension induced by Symbicort --Discontinued  Health Maintenance Immunization History  Administered Date(s) Administered   Influenza, Seasonal,  Injecte, Preservative Fre 12/31/2016, 12/21/2020   Influenza,inj,Quad PF,6+ Mos 12/31/2016, 12/21/2020   Influenza-Unspecified 12/31/2016, 11/23/2019, 11/23/2019, 12/21/2020   Moderna Sars-Covid-2 Vaccination 04/22/2019, 04/22/2019, 05/20/2019, 11/23/2019, 11/23/2019, 12/14/2019   Tdap 07/08/2016   CT Lung Screen- not qualified. Never smoker  Orders Placed This Encounter  Procedures   CT Chest Wo Contrast    Standing Status:   Future    Standing Expiration Date:   12/14/2022    Scheduling Instructions:     Please schedule in Dec 2023    Order Specific Question:   Is patient pregnant?    Answer:   No    Order Specific Question:  Preferred imaging location?    Answer:   [facility]   Flu Vaccine QUAD 83moIM (Fluarix, Fluzone & Alfiuria Quad PF)   Ambulatory referral to Ophthalmology    Referral Priority:   Routine    Referral Type:   Consultation    Referral Reason:   Specialty Services Required    Requested Specialty:   Ophthalmology    Number of Visits Requested:   1   Pulmonary function test    Standing Status:   Future    Standing Expiration Date:   12/14/2022    Order Specific Question:   Where should this test be performed?    Answer:   [facility]    Order Specific Question:   Full PFT: includes the following: basic spirometry, spirometry pre & post bronchodilator, diffusion capacity (DLCO), lung volumes    Answer:   Full PFT   Meds ordered this encounter  Medications   predniSONE (DELTASONE) 20 MG tablet    Sig: Take 1 tablet (20 mg total) by mouth daily with breakfast.    Dispense:  90 tablet    Refill:  0   albuterol (VENTOLIN HFA) 108 (90 Base) MCG/ACT inhaler    Sig: Inhale 2 puffs into the lungs every 6 (six) hours as needed for wheezing or shortness of breath.    Dispense:  18 g    Refill:  1   Return in about 3 months (around 03/14/2022).  I have spent a total time of 45-minutes on the day of the appointment reviewing prior documentation,  coordinating care and discussing medical diagnosis and plan with the patient/family. Imaging, labs and tests included in this note have been reviewed and interpreted independently by me.  CBlair MD [facility] 12/13/2021 8:26 AM  Office Number [phone]

## 2021-12-19 ENCOUNTER — Encounter: Payer: Self-pay | Admitting: Pulmonary Disease

## 2021-12-19 DIAGNOSIS — D869 Sarcoidosis, unspecified: Secondary | ICD-10-CM | POA: Insufficient documentation

## 2022-02-28 ENCOUNTER — Ambulatory Visit (HOSPITAL_COMMUNITY)
Admission: RE | Admit: 2022-02-28 | Discharge: 2022-02-28 | Disposition: A | Discharge status: Home / Self Care | Payer: BC Managed Care – PPO | Source: Ambulatory Visit | Attending: Pulmonary Disease | Admitting: Pulmonary Disease

## 2022-02-28 DIAGNOSIS — D869 Sarcoidosis, unspecified: Secondary | ICD-10-CM | POA: Insufficient documentation

## 2022-03-07 ENCOUNTER — Ambulatory Visit (INDEPENDENT_AMBULATORY_CARE_PROVIDER_SITE_OTHER): Payer: BC Managed Care – PPO | Admitting: Pulmonary Disease

## 2022-03-07 ENCOUNTER — Telehealth: Payer: Self-pay | Admitting: Pulmonary Disease

## 2022-03-07 ENCOUNTER — Encounter (HOSPITAL_BASED_OUTPATIENT_CLINIC_OR_DEPARTMENT_OTHER): Payer: Self-pay | Admitting: Pulmonary Disease

## 2022-03-07 VITALS — BP 110/70 | HR 100 | Ht 68.0 in | Wt 212.4 lb

## 2022-03-07 DIAGNOSIS — Z882 Allergy status to sulfonamides status: Secondary | ICD-10-CM

## 2022-03-07 DIAGNOSIS — D869 Sarcoidosis, unspecified: Secondary | ICD-10-CM

## 2022-03-07 LAB — PULMONARY FUNCTION TEST
DL/VA % pred: 128 %
DL/VA: 5.45 ml/min/mmHg/L
DLCO cor % pred: 101 %
DLCO cor: 24.91 ml/min/mmHg
DLCO unc % pred: 101 %
DLCO unc: 24.91 ml/min/mmHg
FEF 25-75 Post: 2.72 L/sec
FEF 25-75 Pre: 2.1 L/sec
FEF2575-%Change-Post: 29 %
FEF2575-%Pred-Post: 85 %
FEF2575-%Pred-Pre: 65 %
FEV1-%Change-Post: 7 %
FEV1-%Pred-Post: 81 %
FEV1-%Pred-Pre: 75 %
FEV1-Post: 2.69 L
FEV1-Pre: 2.51 L
FEV1FVC-%Change-Post: 5 %
FEV1FVC-%Pred-Pre: 94 %
FEV6-%Change-Post: 2 %
FEV6-%Pred-Post: 82 %
FEV6-%Pred-Pre: 81 %
FEV6-Post: 3.35 L
FEV6-Pre: 3.28 L
FEV6FVC-%Pred-Post: 102 %
FEV6FVC-%Pred-Pre: 102 %
FVC-%Change-Post: 2 %
FVC-%Pred-Post: 80 %
FVC-%Pred-Pre: 79 %
FVC-Post: 3.35 L
FVC-Pre: 3.28 L
Post FEV1/FVC ratio: 80 %
Post FEV6/FVC ratio: 100 %
Pre FEV1/FVC ratio: 76 %
Pre FEV6/FVC Ratio: 100 %
RV % pred: 97 %
RV: 1.84 L
TLC % pred: 92 %
TLC: 5.22 L

## 2022-03-07 MED ORDER — ARNUITY ELLIPTA 200 MCG/ACT IN AEPB: 1.0000 | INHALATION_SPRAY | Freq: Every day | RESPIRATORY_TRACT | 5 refills | Status: DC

## 2022-03-07 MED ORDER — PREDNISONE 20 MG PO TABS: 20.0000 mg | ORAL_TABLET | Freq: Every day | ORAL | 0 refills | Status: DC

## 2022-03-07 NOTE — Progress Notes (Signed)
Full PFT Performed Today  

## 2022-03-07 NOTE — Telephone Encounter (Signed)
LMTCB for pt to schedule f/u after CT. [facility] number was provided.

## 2022-03-07 NOTE — Patient Instructions (Signed)
Full PFT Performed Today  

## 2022-03-07 NOTE — Progress Notes (Signed)
Subjective:   PATIENT ID: Faith Willis GENDER: female DOB: 10/15/1975, MRN: [id number]   HPI  Chief Complaint  Patient presents with   Follow-up    Pft results   Reason for Visit: Follow-up  Faith Willis is a 46 year old female never smoker with sarcoid, allergic rhinitis and reflux who presents for follow-up.  Synopsis: She was previously followed by Dr. Chase Caller at [facility]. Prior to this she was seen at [facility] in [place] from 2020-2021 for calcified pulmonary nodules and mediastinal adenopathy. She was asymptomatic and had normal PFTs. Underwent EBUS 02/08/2019 that was nondiagnostic. In 02/27/2019, she was diagnosed with COVID. Had worsening symptoms and CTA 03/19/19 demonstrated progression of mediastinal and hilar adenopathy and bronchus intermedius obliteration. 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 She was started on steroid taper in 2021. Was on low-dose prednisone in April and off bactrim. Never been on biologics. She moved to [facility] in summer 2021 and established care with [facility] in March 2022.  04/15/21 Since her last visit with Dr. Chase Caller, she was started on Arunity. Improvement on Arnuity by 50%. Still have episodes of cough but not as severe. Cough occurs with rest and not with activity. No shortness of breath or wheezing. Denies chest pain. No LE swelling.    05/31/21 Since her last visit, she was started on Symbicort for cough however this was discontinued due to reported elevated blood pressure readings.  After stopping Symbicort for 4 days she called our office to report continued elevated blood pressure readings.  She also had complaints of chest heaviness that prompted her to visit the ED on 05/25/2021.  ED note reviewed and demonstrated negative work-up with troponin less than 3 and normal CBC and c-Met.  Chest x-ray with no acute abnormalities, noted nodular bilateral perihilar region.   EKG with heart rate 83 and no signs for ischemia per report. Since stopping Symbicort her cough which was previously resolved has now returned. She continues to have hypertension with SBP in the 150s despite stopping the Symbicort >10 days ago. She reports family hx significant for hypertension.  12/13/21 She is currently on Arnuity which improved her coughing and wheezing and chest heaviness. Did not tolerate Symbicort and Spiriva. Previously used to do boot camp. Currently in spin class and weight classes. Only needs albuterol with heavy exercise. She is trying to walk more. She has unchanged night sweats that soak her.  03/07/22 Compliant with her prednisone and Arnuity. Cough has completely resolved. No concerns or compliants at this time. Completed PFTs and wanting to discuss CT scan  Prior inhalers: Symbicort - elevated BP Spiriva - ineffective   Social History: Never smoker  Past Medical History:  Diagnosis Date   Acid reflux    Allergic rhinitis    Arthritis    Sarcoid     Family History  Problem Relation Age of Onset   Diabetes Other      Social History   Occupational History   Not on file  Tobacco Use   Smoking status: Never   Smokeless tobacco: Never  Substance and Sexual Activity   Alcohol use: Not on file   Drug use: Not on file   Sexual activity: Not on file    Allergies  Allergen Reactions   Sulfa Antibiotics Hives and Rash   Cefuroxime Diarrhea and Nausea And Vomiting    diarrhea diarrhea diarrhea diarrhea    Hydrocodone-Acetaminophen     Other reaction(s): Dizziness Passed out [employer]  out Passed out Passed out      Outpatient Medications Prior to Visit  Medication Sig Dispense Refill   albuterol (VENTOLIN HFA) 108 (90 Base) MCG/ACT inhaler Inhale 2 puffs into the lungs every 6 (six) hours as needed for wheezing or shortness of breath. 18 g 1   ascorbic acid (VITAMIN C) 100 MG tablet Take by mouth.     calcium-vitamin D 250-100 MG-UNIT  tablet Take 1 tablet by mouth 2 (two) times daily.     cetirizine (ZYRTEC) 10 MG tablet Take by mouth.     diphenhydrAMINE (BENADRYL) 25 mg capsule Take by mouth.     losartan (COZAAR) 50 MG tablet Take by mouth.     Multiple Vitamin (MULTIVITAMIN) capsule Take 1 capsule by mouth daily.     omeprazole (PRILOSEC) 20 MG capsule Take 1 capsule (20 mg total) by mouth daily.     Fluticasone Furoate (ARNUITY ELLIPTA) 200 MCG/ACT AEPB Inhale 1 puff into the lungs daily. 30 each 5   predniSONE (DELTASONE) 20 MG tablet Take 1 tablet (20 mg total) by mouth daily with breakfast. 90 tablet 0   No facility-administered medications prior to visit.    Review of Systems  Constitutional:  Negative for chills, diaphoresis, fever, malaise/fatigue and weight loss.  HENT:  Negative for congestion.   Respiratory:  Negative for cough, hemoptysis, sputum production, shortness of breath and wheezing.   Cardiovascular:  Negative for chest pain, palpitations and leg swelling.     Objective:   Vitals:   03/07/22 1151  BP: 110/70  Pulse: 100  SpO2: 97%  Weight: 212 lb 6.4 oz (96.3 kg)  Height: 5' 8" (1.727 m)   SpO2: 97 % O2 Device: None (Room air)  Physical Exam: General: Well-appearing, no acute distress HENT: [place], AT Eyes: EOMI, no scleral icterus Respiratory: Clear to auscultation bilaterally.  No crackles, wheezing or rales Cardiovascular: RRR, -M/R/G, no JVD Extremities:-Edema,-tenderness Neuro: AAO x4, CNII-XII grossly intact Psych: Normal mood, normal affect  Data Reviewed:  Imaging: CT Chest 07/17/20 Mediastinal and hilar parenchymal findings of sarcoid. RML bronchus narrowing. Peripheral right lower lobe nodule measuring 87m CT chest 05/14/2021-mildly enlarged bilateral hilar and mediastinal nodes.  Right lower lobe nodule 121mand 8 mm, previously 9 and 8 respectively.  Perihilar nodule in the right middle lobe enlarged from 9 to 1.3 cm. CT Chest 12/06/21 - Innumerable pulmonary nodules,  increased in right upper and lower lung. Unchanged size in nodules with largest 13 mm. Enlarged mediastinal and hilar lymph nodes CT Chest 03/07/22 - Improved pulmonary nodules in size and density  PFT: 07/17/20 FVC 3.45 (82%) FEV1 2.8 (83%) Ratio 76  TLC 92% DLCO 112% Interpretation: Normal PFTs. No significant BD response  04/15/20 FVC 3.35 (80%) FEV1 2.65 (79%) Ratio 74  TLC 95% DLCO 96% Interpretation: Normal PFTs. No significant change in FVC and FEV1. No significant BD response.  03/07/22 FVC 3.35 (80%) FEV1 2.69 (81%) Ratio 76  TLC 92% DLCO 101% Interpretation: Normal PFTs. Stable   Labs: CBC    Component Value Date/Time   WBC 6.2 05/09/2021 1638   RBC 4.90 05/09/2021 1638   HGB 14.3 05/09/2021 1638   HCT 44.4 05/09/2021 1638   PLT 322 05/09/2021 1638   MCV 91 05/09/2021 1638   MCH 29.2 05/09/2021 1638   MCHC 32.2 05/09/2021 1638   RDW 13.2 05/09/2021 1638   LYMPHSABS 1.4 05/09/2021 1638   EOSABS 0.2 05/09/2021 1638   BASOSABS 0.0 05/09/2021 1638   BMET  Component Value Date/Time   NA 140 05/09/2021 1638   K 4.4 05/09/2021 1638   CL 102 05/09/2021 1638   CO2 24 05/09/2021 1638   GLUCOSE 121 (H) 05/09/2021 1638   BUN 16 05/09/2021 1638   CREATININE 0.81 05/09/2021 1638   CALCIUM 8.9 05/09/2021 1638   EGFR 92 05/09/2021 1638   EKG/Echo: EKG 04/05/19 - Incomplete RBBB per report Echo - 05/14/2021 - EF 60 to 65%.  No WMA.  Normal diastolic.  Normal RV size and function.  No valvular abnormalities EKG 05/24/2021 - normal sinus rhythm.  Heart rate 83 PR interval 166 QTc 455  Pathology: [facility] AP CASE REPORT:Surgical Pathology                                Case: [id number]                                  Authorizing Provider:  Dalbert Batman, MD   Collected:           03/21/2019 1138              Ordering Location:     [place]:            03/21/2019 1159                                     Operating Room                                                               Pathologist:           Gean Maidens, MD                                                        Intraop:               Gean Maidens, MD                                                        Specimens:   A) - Lung, left lower lobe biopsy                                                                   B) - Lung, bronchus intermedius  C) - Lung, left upper lobe bipsy                                                                   D) - Trachea, tracheal biopsy                                                                       E) - Mediastinum , proximal 4R                                                                     F) - Lung, bronchus intermediate                                                      [employer] [employer] AP PRE-OPERATIVE DIAGNOSIS:Lymphadenopathy  [facility] AP PATHOLOGY PROCEDURES:Bronchoscopy, cervical mediastinoscopy, EBUS, EGD  Bronchoscopy w/EBUS  EGD  [facility] AP FINAL DIAGNOSIS:  A. LEFT LOWER LOBE BIOPSY:   BRONCHIAL MUCOSA SHOWING GRANULATION TISSUE AND CHRONIC ACTIVE  INFLAMMATION.   B. BRONCHUS INTERMEDIUS, BIOPSY:   ULCERATED BRONCHIAL MUCOSA SHOWING GRANULATION TISSUE AND CHRONIC ACTIVE  INFLAMMATION.   NO ACID FAST OR FUNGAL ORGANISMS SEEN ON AFB, GMS, PAS AND MUCICARMINE  STAINS.     C.  LEFT UPPER LOBE BIOPSY:   BRONCHIAL MUCOSA SHOWING GRANULATION TISSUE AND CHRONIC ACTIVE  INFLAMMATION.   D.  TRACHEA, BIOPSY:   ULCERATED BRONCHIAL MUCOSA SHOWING GRANULATION TISSUE AND CHRONIC ACTIVE  INFLAMMATION.   E.  PROXIMAL 4R, EBUS-GUIDED BIOPSY:   LYMPH NODE TISSUE SHOWING CONFLUENT WELL FORMED EPITHELIOID GRANULOMAS,  FOCALLY NECROTIZING.   NO ACID FAST OR FUNGAL ORGANISMS SEEN ON AFB AND GMS STAINS.   NEGATIVE FOR TUMOR.   NOTE: THE APPEARANCE IS CONSISTENT WITH SARCOIDOSIS, HOWEVER, AN  INFECTIOUS PROCESS IS ALSO IN THE DIFFERENTIAL  DIAGNOSIS.   F.  BRONCHUS INTERMEDIUS, BIOPSY:   ULCERATED BRONCHIAL MUCOSA SHOWING GRANULATION TISSUE AND CHRONIC ACTIVE  INFLAMMATION.   Note: The airway biopsies show no evidence of malignancy and no viral  cytopathic effect is identified.     The case was referred to [facility] for  consultation.  Their report follows:   PATHOLOGIC DIAGNOSIS:  Consult slides from [facility], [place], [place]  [phone];  03/21/2019):   A.  Lung, left lower lobe, biopsy:    Bronchial mucosa with granulation tissue formation and admixed mixed  acute and chronic inflammation.   B. Lung, bronchus intermedius, frozen section biopsy:   Ulcerated bronchial mucosa with granulation tissue formation, admixed  mixed acute and chronic inflammation, fibrinopurulent exudate, and focus  of histiocytic aggregates.  Received stains for AFB, GMS, PAS, and mucicarmine are negative.   C.  Lung, left upper lobe, biopsy:   Inflamed bronchial mucosa with granulation tissue formation and admixed  mixed acute and chronic inflammation.   D.  Trachea, biopsy:   Ulcerated bronchial mucosa with granulation tissue formation, admixed  mixed acute and chronic inflammation, fibrinopurulent exudate and focus of  histiocytic aggregates.   E.  Mediastinum, lymph node, proximal 4R, biopsy:   Lymph node with confluent well-formed, focally necrotizing granulomas with  hyalinization, see note.   Note:  Received stains for GMS and AFB are negative for micro-organisms.    The differential diagnosis includes sarcoidosis and an infectious process,  clinical correlation recommended.   F.  Lung, bronchus intermediate, biopsy:   Ulcerated bronchial mucosa with granulation tissue formation, admixed  mixed acute and chronic inflammation, and fibrinopurulent exudate.   Electronically signed out:  Dawson Bills, M.D.   Date:  04/01/2019  [facility] Department  of Pathology     Assessment & Plan:   Discussion: 46 year old female never smoker with sarcoid, allergic rhinitis and reflux who presents for follow-up.She has had prior work-up for chronic cough with normal PFTs. CT reviewed with progressive changes suggestive of sarcoid flare. S/p prednisone 3 months with improved radiographic findings. Discussed ongoing immunosuppression. Previously discussed risks of chronic steroids. Offered to continue current prednisone dose vs methotrexate. After shared decision making will plan to continue steroids. She would benefit from PJP ppx however has sulfa allergy. She prefers not to take it until seen by Allergy for evaluation first. At any point in time if she is unable to tolerate steroids or if sarcoid worsens, will recommend transitioning to methotrexate.  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 Reviewed and stable - Recommend annual ophthalmology exam.  Last visit on 2021. REFER to optho - Reviewed echo 04/2021 and EKG: Normal  Chronic cough - resolved --CONTINUE Arnuity ONE puff daily --Failed Symbicort and Spiriva: Possible ADE with hypertension --CONTINUE Albuterol as needed for shortness of breath, wheezing and cough  High risk medication monitoring --Reviewed CT Chest with improving sarcoid - Previously completed taper in 2021 - Restart taper 11/2021 --CONTINUE prednisone 20 mg daily --ORDER CT Chest without contrast in March 2024 --Continue omeprazole 20 mg daily for ulcer prevention with steroids  Osteoporosis prevention in setting of chronic steroid use If you are taking >2.5 mg of prednisone for >3 month regardless of risk fracture or have moderate-high risk fracture: --Recommend lifestyple modifications (e.g. balanced diet, smoking cessation, moderate alcohol intake and regular weight bearing or resistance exercises) --Recommend calcium 1000 mg and and vitamin D 800U    Health  Maintenance Immunization History  Administered Date(s) Administered   Influenza, Seasonal, Injecte, Preservative Fre 12/31/2016, 12/21/2020   Influenza,inj,Quad PF,6+ Mos 12/31/2016, 12/21/2020, 12/13/2021   Influenza-Unspecified 12/31/2016, 11/23/2019, 11/23/2019, 12/21/2020   Moderna Sars-Covid-2 Vaccination 04/22/2019, 04/22/2019, 05/20/2019, 11/23/2019, 11/23/2019, 12/14/2019   Tdap 07/08/2016   CT Lung Screen- not qualified. Never smoker  Orders Placed This Encounter  Procedures   CT Chest Wo Contrast    Standing Status:   Future    Standing Expiration Date:   03/08/2023    Scheduling Instructions:     Schedule in 3 months in March    Order Specific Question:   Is patient pregnant?    Answer:   No    Order Specific Question:   Preferred imaging location?  Answer:   [facility]   Ambulatory referral to Allergy    Referral Priority:   Routine    Referral Type:   Allergy Testing    Referral Reason:   Specialty Services Required    Requested Specialty:   Allergy    Number of Visits Requested:   1   Meds ordered this encounter  Medications   predniSONE (DELTASONE) 20 MG tablet    Sig: Take 1 tablet (20 mg total) by mouth daily with breakfast.    Dispense:  90 tablet    Refill:  0   Fluticasone Furoate (ARNUITY ELLIPTA) 200 MCG/ACT AEPB    Sig: Inhale 1 puff into the lungs daily.    Dispense:  30 each    Refill:  5   Return in about 3 months (around 06/06/2022).  I have spent a total time of 35-minutes on the day of the appointment including chart review, data review, collecting history, coordinating care and discussing medical diagnosis and plan with the patient/family. Past medical history, allergies, medications were reviewed. Pertinent imaging, labs and tests included in this note have been reviewed and interpreted independently by me.  [place], MD [facility] 03/07/2022 1:06 PM  Office Number [phone]

## 2022-03-07 NOTE — Telephone Encounter (Unsigned)
CT scheduled for 3/15 at 10:30am here at [facility]   No OV scheduled yet, will call pt

## 2022-03-07 NOTE — Patient Instructions (Addendum)
Sarcoidosis --Continue prednisone 20 mg daily --Refer to Allergy for bactrim desensitization --ORDER CT Chest without contrast in March 2024 --Continue omeprazole 20 mg daily --Continue calcium and vitamin D  Follow-up with me in 3 months

## 2022-03-10 NOTE — Telephone Encounter (Signed)
Please schedule patient for March 28th at 9 AM for follow-up sarcoid

## 2022-03-11 NOTE — Telephone Encounter (Signed)
Pt was sch for f/u closing note now. Nothing further needed

## 2022-04-16 ENCOUNTER — Encounter (HOSPITAL_BASED_OUTPATIENT_CLINIC_OR_DEPARTMENT_OTHER): Payer: Self-pay | Admitting: Pulmonary Disease

## 2022-04-16 DIAGNOSIS — Z882 Allergy status to sulfonamides status: Secondary | ICD-10-CM

## 2022-04-17 NOTE — Telephone Encounter (Signed)
Mychart message sent by pt: Faith Willis  P [facility] (supporting Chi Rodman Pickle, MD)16 hours ago (4:32 PM)    Hello  I tried to get a appt with the allergist but I couldn't get in until may. Is that okay? I could try with [place].     Dr. Loanne Drilling please advise.

## 2022-05-03 IMAGING — CT CT CHEST HIGH RESOLUTION W/O CM
2 of 8 series · 14 of 36 positions shown, 17 images · non-contrast
Comparison: None.

CLINICAL DATA: Sarcoid.  Post COVID.  Persistent cough.

EXAM:
CT CHEST WITHOUT CONTRAST
TECHNIQUE: Multidetector CT imaging of the chest was performed following the
standard protocol without intravenous contrast. High resolution
imaging of the lungs, as well as inspiratory and expiratory imaging,
was performed.

[Series 6: high resolution · axial · 0.57mm/px · z∈[-240,+2]mm · 11 of 292 slices shown, 14 images]
[im 25/292  mediastinal]
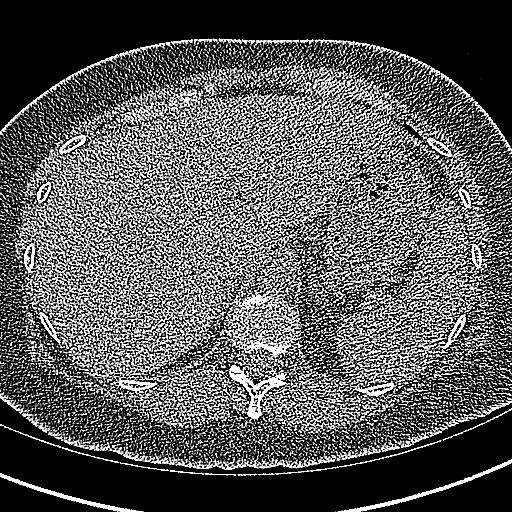
[im 25/292  lung]
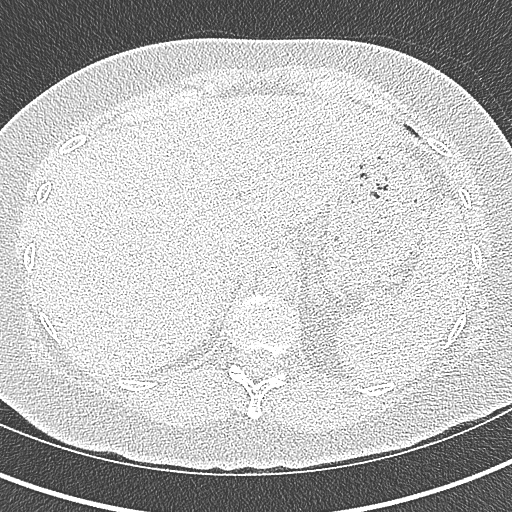
[im 49/292  lung]
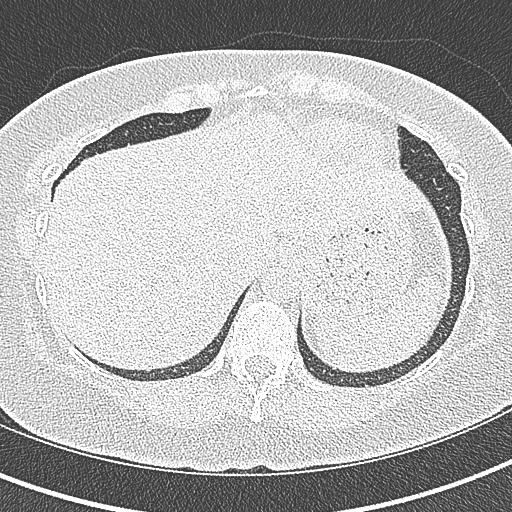
[im 73/292  lung]
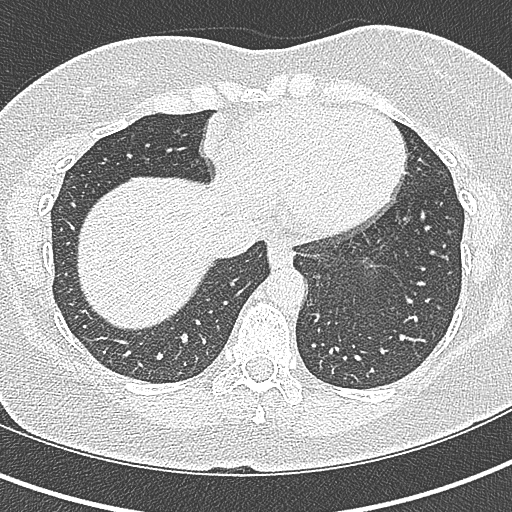
[im 98/292  lung]
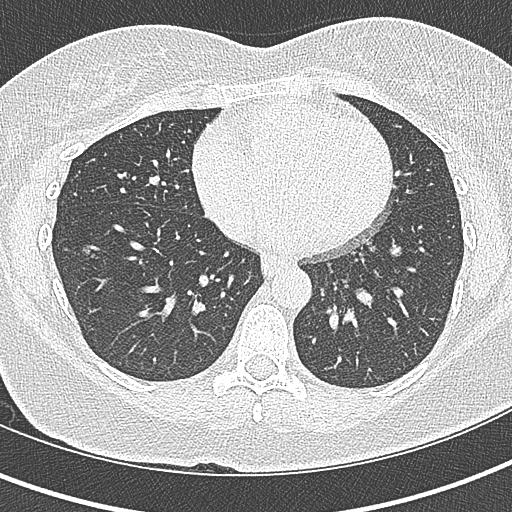
[im 122/292  mediastinal]
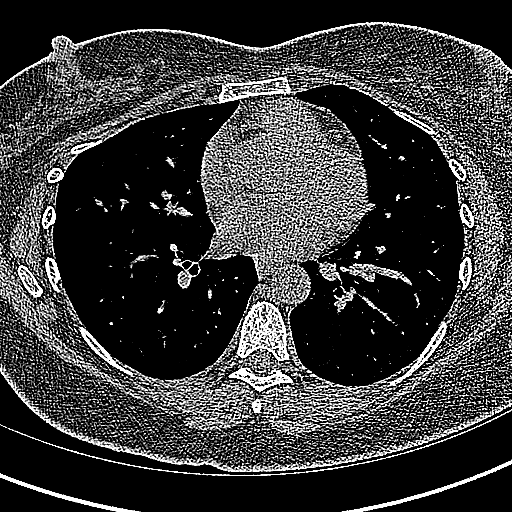
[im 122/292  lung]
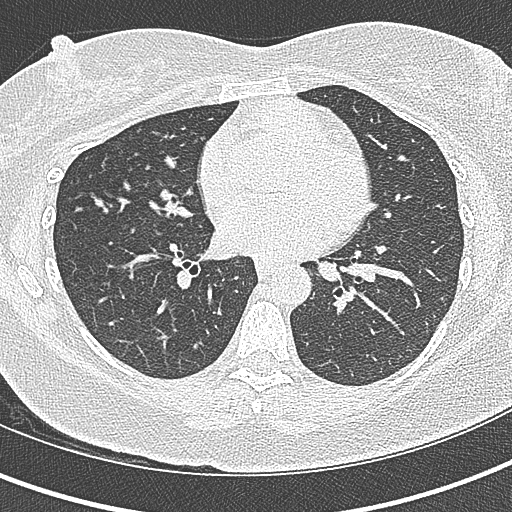
[im 146/292  lung]
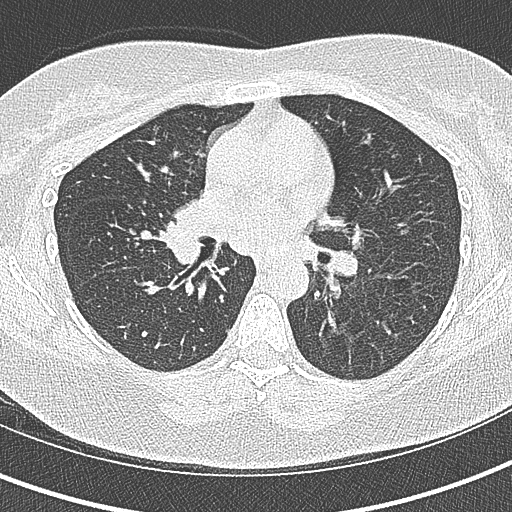
[im 170/292  lung]
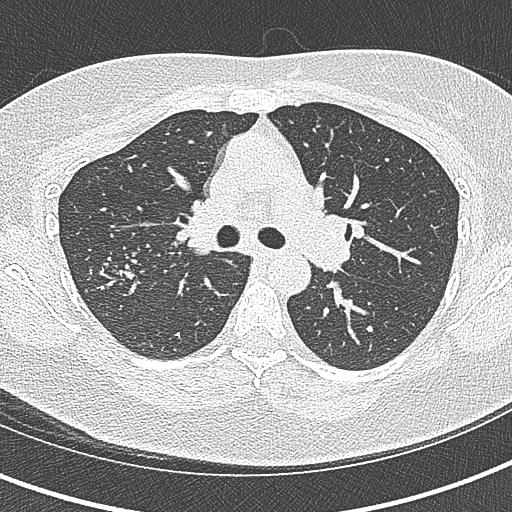
[im 195/292  lung]
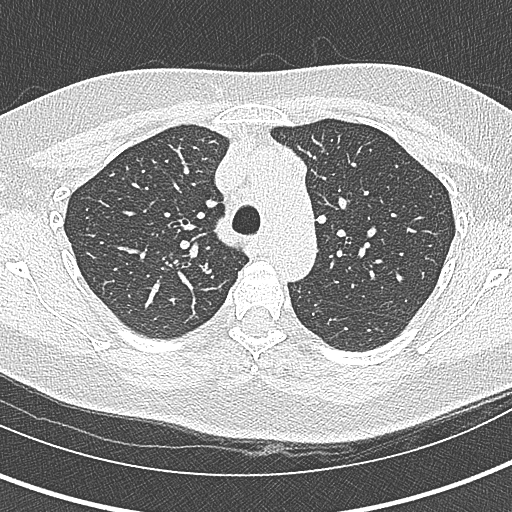
[im 219/292  mediastinal]
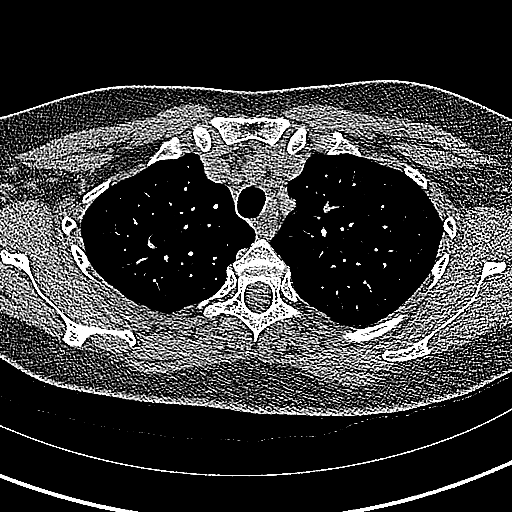
[im 219/292  lung]
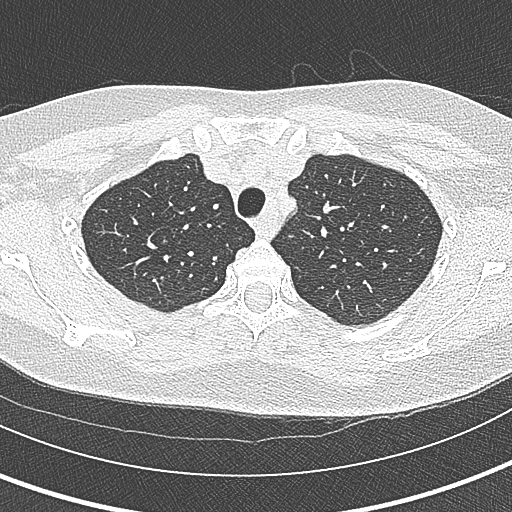
[im 243/292  lung]
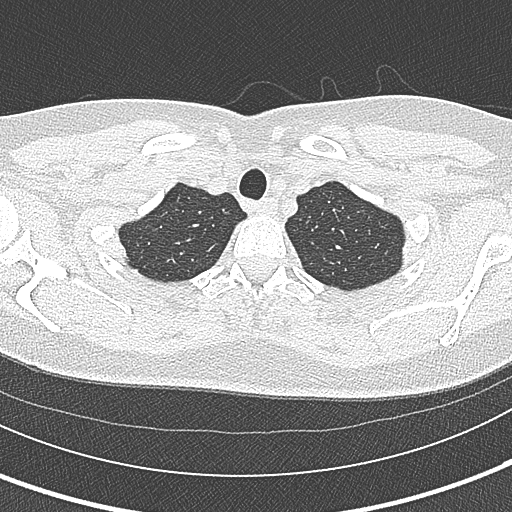
[im 267/292  lung]
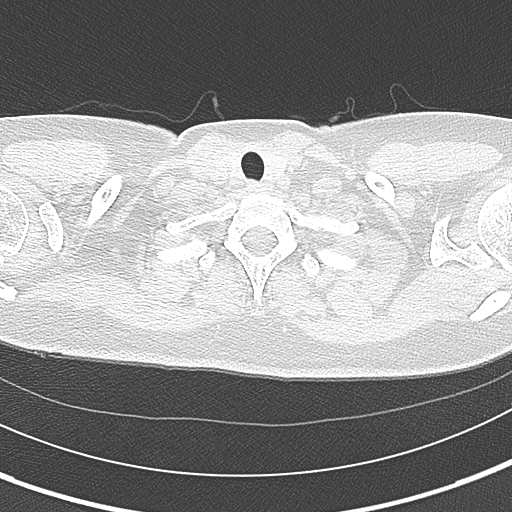

[Series 8: coronal · coronal · 0.55mm/px · 3 of 110 slices shown]
[im 22/110  lung]
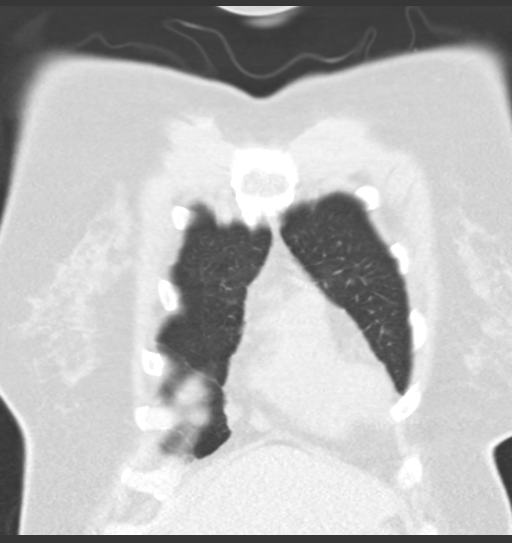
[im 44/110  lung]
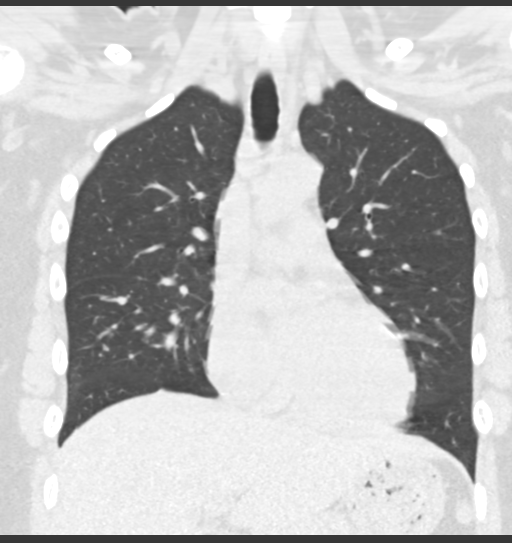
[im 66/110  lung]
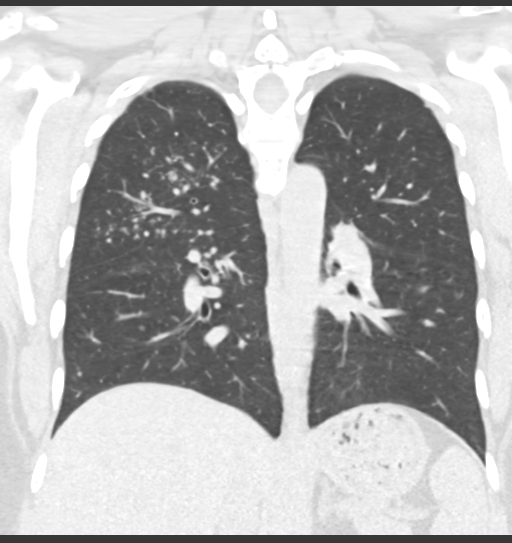

[14 of 36 positions shown; findings below may reference images not displayed]

FINDINGS: Cardiovascular: Heart is at the upper limits of normal in size. No
pericardial effusion.

Mediastinum/Nodes: Mediastinal adenopathy measures up to 11 mm in
the low right paratracheal station. Subcarinal lymph node measures
1.5 cm. Calcified mediastinal and bihilar lymph nodes.
Prepericardiac lymph nodes measure up to 11 mm. Axillary lymph nodes
are not enlarged by CT size criteria. Esophagus is grossly
unremarkable.

Lungs/Pleura: There is centrally predominant peribronchovascular
nodularity with mild traction bronchiectasis. Largest individual
nodule measures 8 mm in the peripheral right lower lobe (5/103).
Narrowing of the lateral segment right middle lobe bronchus, likely
due to adenopathy. Negative for subpleural reticulation,
ground-glass, architectural distortion or honeycombing. No pleural
fluid. Airway is otherwise unremarkable. Mild air trapping.

Upper Abdomen: Visualized portions of the liver, adrenal glands and
kidneys are unremarkable. Probable splenule between the proximal
stomach and left adrenal gland. Visualized portions of the spleen,
pancreas and stomach are otherwise unremarkable.

Musculoskeletal: Mild degenerative changes in the spine. No
worrisome lytic or sclerotic lesions.
IMPRESSION: 1. Mediastinal/hilar and pulmonary parenchymal findings of sarcoid.
Associated narrowing of the right middle lobe lateral segmental
bronchus.
2. Largest individual nodule measures 8 mm in the peripheral right
lower lobe and is likely due to sarcoid. Non-contrast chest CT at
3-6 months is recommended, as clinically indicated. If the nodules
are stable at time of repeat CT, then future CT at 18-24 months
(from today's scan) is considered optional for low-risk patients,
but is recommended for high-risk patients. This recommendation
follows the consensus statement: Guidelines for Management of
Incidental Pulmonary Nodules Detected on CT Images: From the
3. Mild air trapping is indicative of small airways disease.

## 2022-05-21 NOTE — Progress Notes (Signed)
NEW PATIENT Date of Service/Encounter:  05/23/22 Referring provider: Margaretha Seeds, MD Primary care provider: Patient, No Pcp Per  Subjective:  Faith Willis is a 47 y.o. female with a PMHx of pulmonary sarcoidosis and arthritis presenting today for evaluation of sulfa allergy. History obtained from: chart review and patient and daughter .   Sulfa allergy/pulmonary sarcoidosis- Has pulmonary sarcoidosis, considering PJP ppx due to chronic steroid use, with history of sulfa allergy managed by Pulmonary.  They would like to start daily Bactrim treatment. However she remembers having a rash after sulfas as a kid. She does not remember anything else about  her reaction.  This was 40 years ago.  She has avoided ever since. Awaiting CT scan, possibly going to stop steroids or change to a different medication for her sarcoidosis.  Environmental allergies Has been told she is allergic to cats, dogs, trees, molds. Itchy skin, itchy and watery eyes. Used to get sinus infections frequently when living up [place], but none now that she is in the [place]. She has been here in [place] for 3 years.  She is taking zyrtec in AM and benadryl in PM and this is a good mix for her. Benadryl does not make her sleepy, but she has not slept since stopping 3 days ago. Perennial symptoms. No sinus surgeries. Environmental exposures: cats (8), no smoke exposure, carpets in bedrooms Acid reflux on omeprazole 20 mg once daily x 4 years, partially controlled.  Not seeing GI.  Other allergy screening: Food allergy: no Hymenoptera allergy: no Urticaria: no Eczema:no History of recurrent infections suggestive of immunodeficency: no Vaccinations are up to date.   Past Medical History: Past Medical History:  Diagnosis Date   Acid reflux    Allergic rhinitis    Arthritis    Sarcoid    Medication List:  Current Outpatient Medications  Medication Sig Dispense Refill   albuterol (VENTOLIN HFA) 108 (90  Base) MCG/ACT inhaler Inhale 2 puffs into the lungs every 6 (six) hours as needed for wheezing or shortness of breath. 18 g 1   ascorbic acid (VITAMIN C) 100 MG tablet Take by mouth.     calcium-vitamin D 250-100 MG-UNIT tablet Take 1 tablet by mouth 2 (two) times daily.     cetirizine (ZYRTEC) 10 MG tablet Take by mouth.     diphenhydrAMINE (BENADRYL) 25 mg capsule Take by mouth.     Fluticasone Furoate (ARNUITY ELLIPTA) 200 MCG/ACT AEPB Inhale 1 puff into the lungs daily. 30 each 5   losartan (COZAAR) 50 MG tablet Take by mouth.     Multiple Vitamin (MULTIVITAMIN) capsule Take 1 capsule by mouth daily.     omeprazole (PRILOSEC) 20 MG capsule Take 1 capsule (20 mg total) by mouth daily.     predniSONE (DELTASONE) 20 MG tablet Take 1 tablet (20 mg total) by mouth daily with breakfast. 90 tablet 0   sulfamethoxazole-trimethoprim (BACTRIM) 200-40 MG/5ML suspension Do NOT TAKE-Bring to Allergy clinic for challenge. 100 mL 0   No current facility-administered medications for this visit.   Known Allergies:  Allergies  Allergen Reactions   Sulfa Antibiotics Hives and Rash   Cefuroxime Diarrhea and Nausea And Vomiting    diarrhea diarrhea diarrhea diarrhea    Hydrocodone-Acetaminophen     Other reaction(s): Dizziness Passed out Passed out Passed out Passed out    Past Surgical History: History reviewed. No pertinent surgical history. Family History: Family History  Problem Relation Age of Onset   Diabetes Other  Social History: Tobey lives in a house built 10+ years ago, carpet in the bedroom, electric heating, central AC, indoor cats x 8, no roaches, not using dust mite protection on the bedding or pillows, no smoke exposure.  She is an Scientist, clinical (histocompatibility and immunogenetics).  No HEPA filter in the home.  [place]   ROS:  All other systems negative except as noted per HPI.  Objective:  Blood pressure 130/80, pulse 92, temperature 97.7 F (36.5 C),  temperature source Temporal, resp. rate 18, height 5' 8.5" (1.74 m), weight 219 lb 12.8 oz (99.7 kg), SpO2 99 %. Body mass index is 32.93 kg/m. Physical Exam:  General Appearance:  Alert, cooperative, no distress, appears stated age  Head:  Normocephalic, without obvious abnormality, atraumatic  Eyes:  Conjunctiva clear, EOM's intact  Nose: Nares normal, hypertrophic turbinates, normal mucosa, and no visible anterior polyps  Throat: Lips, tongue normal; teeth and gums normal, normal posterior oropharynx  Neck: Supple, symmetrical  Lungs:   clear to auscultation bilaterally, Respirations unlabored, no coughing  Heart:  regular rate and rhythm and no murmur, Appears well perfused  Extremities: No edema  Skin: Skin color, texture, turgor normal, no rashes or lesions on visualized portions of skin  Neurologic: No gross deficits     Diagnostics: Skin Testing: Environmental allergy panel.  Adequate controls. Results discussed with patient/family.  Airborne Adult Perc - 05/23/22 0953     Time Antigen Placed 1000    Allergen Manufacturer Lavella Hammock    Location Back    Number of Test 59    1. Control-Buffer 50% Glycerol Negative    2. Control-Histamine 1 mg/ml 3+    3. Albumin saline Negative    4. [place] Negative    5. [place] Negative    6. Johnson Negative    7. [place] Blue Negative    8. Meadow Fescue Negative    9. Perennial Rye 2+    10. Sweet Vernal 2+    11. Timothy Negative    12. Cocklebur Negative    13. Burweed Marshelder Negative    14. Ragweed, short Negative    15. Ragweed, Giant 3+    16. Plantain,  English Negative    17. Lamb's Quarters 3+    18. Sheep Sorrell Negative    19. Rough Pigweed 3+    20. Marsh Elder, Rough 2+    21. Mugwort, Common 2+    22. Ash mix Negative    23. Birch mix Negative    24. Beech American 2+    25. Box, Elder 3+    26. Cedar, red 2+    27. Cottonwood, [place] Negative    28. Elm mix Negative    29. Hickory 2+    30. Maple mix  2+    31. Oak, [place] mix Negative    32. Pecan Pollen 3+    33. Pine mix 2+    34. Sycamore Eastern Negative    35. [place], Black Pollen Negative    36. Alternaria alternata Negative    37. Cladosporium Herbarum 3+    38. Aspergillus mix 3+    39. Penicillium mix 3+    40. Bipolaris sorokiniana (Helminthosporium) 2+    41. Drechslera spicifera (Curvularia) Negative    42. Mucor plumbeus Negative    43. Fusarium moniliforme 2+    44. Aureobasidium pullulans (pullulara) 2+    45. Rhizopus oryzae 2+    46. Botrytis cinera Negative    47. Epicoccum  nigrum 3+    48. Phoma betae 3+    49. Candida Albicans Negative    50. Trichophyton mentagrophytes Negative    51. Mite, D Farinae  5,000 AU/ml 2+    52. Mite, D Pteronyssinus  5,000 AU/ml Negative    53. Cat Hair 10,000 BAU/ml 3+    54.  Dog Epithelia 3+    55. Mixed Feathers 2+    56. Horse Epithelia 3+    57. Cockroach, German Negative    58. Mouse Negative    59. Tobacco Leaf 3+             Allergy testing results were read and interpreted by myself, documented by clinical staff.  Assessment and Plan  Chronic Rhinitis Seasonal and Perennial Allergic: - allergy testing today: positive to grass pollen, weed pollen, tree pollen, dust mites, cat, dog, horse, mixed feather, tobacco leaf - Prevention:  - allergen avoidance when possible - consider allergy shots as long term control of your symptoms by teaching your immune system to be more tolerant of your allergy triggers - Symptom control: - Start Astelin (azelastine) 1-2 sprays in each nostril twice a day as needed for nasal congestion/itchy nose - Continue a NON-SEDATING Antihistamine: daily or twice daily as needed.  Avoid benadryl -Options include Zyrtec (Cetirizine) '10mg'$ , Claritin (Loratadine) '10mg'$ , Allegra (Fexofenadine) '180mg'$ , or Xyzal (Levocetirinze) '5mg'$  - Can be purchased over-the-counter if not covered by insurance.  Allergic Conjunctivitis:  - Consider Allergy  Eye drops-great options include Pataday (Olopatadine) or Zaditor (ketotifen) for eye symptoms daily as needed-both sold over the counter if not covered by insurance.  -Avoid eye drops that say red eye relief as they may contain medications that dry out your eyes.  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 -if challenge fails, can consider desensitization vs challenge to dapsone  This will need to be scheduled with me.  Drug challenge instructions: You must be off antihistamines for 3-5 days before. Must be in good health and not ill. No vaccines/injections/antibiotics within the past 7 days. Plan on being in the office for 2-4 hours and must bring in the drug/medication you want to do the oral challenge for.  Follow up : Bactrim challenge It was a pleasure meeting you in clinic today! Thank you for allowing me to participate in your care.  This note in its entirety was forwarded to the Provider who requested this consultation.  Thank you for your kind referral. I appreciate the opportunity to take part in [facility] care. Please do not hesitate to contact me with questions.  Sincerely,  Sigurd Sos, MD [facility]

## 2022-05-23 ENCOUNTER — Encounter: Payer: Self-pay | Admitting: Internal Medicine

## 2022-05-23 ENCOUNTER — Ambulatory Visit (INDEPENDENT_AMBULATORY_CARE_PROVIDER_SITE_OTHER): Payer: BC Managed Care – PPO | Admitting: Internal Medicine

## 2022-05-23 VITALS — BP 130/80 | HR 92 | Temp 97.7°F | Resp 18 | Ht 68.5 in | Wt 219.8 lb

## 2022-05-23 DIAGNOSIS — J302 Other seasonal allergic rhinitis: Secondary | ICD-10-CM

## 2022-05-23 DIAGNOSIS — Z882 Allergy status to sulfonamides status: Secondary | ICD-10-CM | POA: Insufficient documentation

## 2022-05-23 DIAGNOSIS — J3089 Other allergic rhinitis: Secondary | ICD-10-CM

## 2022-05-23 DIAGNOSIS — T7840XA Allergy, unspecified, initial encounter: Secondary | ICD-10-CM

## 2022-05-23 DIAGNOSIS — H1013 Acute atopic conjunctivitis, bilateral: Secondary | ICD-10-CM | POA: Diagnosis not present

## 2022-05-23 MED ORDER — AZELASTINE HCL 0.15 % NA SOLN: 1.0000 | Freq: Two times a day (BID) | NASAL | 5 refills | Status: DC | PRN

## 2022-05-23 MED ORDER — SULFAMETHOXAZOLE-TRIMETHOPRIM 200-40 MG/5ML PO SUSP: ORAL | 0 refills | Status: DC

## 2022-05-23 NOTE — Patient Instructions (Signed)
Chronic Rhinitis Seasonal and Perennial Allergic: - allergy testing today: positive to grass pollen, weed pollen, tree pollen, dust mites, cat, dog, horse, mixed feather, tobacco leaf  - Prevention:  - allergen avoidance when possible - consider allergy shots as long term control of your symptoms by teaching your immune system to be more tolerant of your allergy triggers  - Symptom control: - Start Astelin (azelastine) 1-2 sprays in each nostril twice a day as needed for nasal congestion/itchy nose - Continue a NON-SEDATING Antihistamine: daily or twice daily as needed.  Avoid benadryl -Options include Zyrtec (Cetirizine) '10mg'$ , Claritin (Loratadine) '10mg'$ , Allegra (Fexofenadine) '180mg'$ , or Xyzal (Levocetirinze) '5mg'$  - Can be purchased over-the-counter if not covered by insurance.  Allergic Conjunctivitis:  - Consider Allergy Eye drops-great options include Pataday (Olopatadine) or Zaditor (ketotifen) for eye symptoms daily as needed-both sold over the counter if not covered by insurance.  -Avoid eye drops that say red eye relief as they may contain medications that dry out your eyes.  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 -if challenge fails, can consider desensitization vs challenge to dapsone  This will need to be scheduled with me.  Drug challenge instructions: You must be off antihistamines for 3-5 days before. Must be in good health and not ill. No vaccines/injections/antibiotics within the past 7 days. Plan on being in the office for 2-4 hours and must bring in the drug/medication you want to do the oral challenge for.  Follow up : Bactrim challenge It was a pleasure meeting you in clinic today! Thank you for allowing me to participate  in your care.  Sigurd Sos, MD [facility]  Reducing Pollen Exposure  The American Academy of Allergy, Asthma and Immunology suggests the following steps to reduce your exposure to pollen during allergy seasons.    Do not hang sheets or clothing out to dry; pollen may collect on these items. Do not mow lawns or spend time around freshly cut grass; mowing stirs up pollen. Keep windows closed at night.  Keep car windows closed while driving. Minimize morning activities outdoors, a time when pollen counts are usually at their highest. Stay indoors as much as possible when pollen counts or humidity is high and on windy days when pollen tends to remain in the air longer. Use air conditioning when possible.  Many air conditioners have filters that trap the pollen spores. Use a HEPA room air filter to remove pollen form the indoor air you breathe. DUST MITE AVOIDANCE MEASURES:  There are three main measures that need and can be taken to avoid house dust mites:  Reduce accumulation of dust in general -reduce furniture, clothing, carpeting, books, stuffed animals, especially in bedroom  Separate yourself from the dust -use pillow and mattress encasements (can be found at stores such as [employer] or online) -avoid direct exposure to air condition flow -use a HEPA filter device, especially in the bedroom; you can also use a HEPA filter vacuum cleaner -wipe dust with a moist towel instead of a dry towel or broom when cleaning  Decrease mites and/or their secretions -wash clothing and linen and stuffed animals at highest temperature possible, at least every 2 weeks -stuffed animals can  also be placed in a bag and put in a freezer overnight  Despite the above measures, it is impossible to eliminate dust mites or their allergen completely from your home.  With the above measures the burden of mites in your home can be diminished, with the goal of minimizing your  allergic symptoms.  Success will be reached only when implementing and using all means together. Control of Dog or Cat Allergen  Avoidance is the best way to manage a dog or cat allergy. If you have a dog or cat and are allergic to dog or cats, consider removing the dog or cat from the home. If you have a dog or cat but don't want to find it a new home, or if your family wants a pet even though someone in the household is allergic, here are some strategies that may help keep symptoms at bay:  Keep the pet out of your bedroom and restrict it to only a few rooms. Be advised that keeping the dog or cat in only one room will not limit the allergens to that room. Don't pet, hug or kiss the dog or cat; if you do, wash your hands with soap and water. High-efficiency particulate air (HEPA) cleaners run continuously in a bedroom or living room can reduce allergen levels over time. Regular use of a high-efficiency vacuum cleaner or a central vacuum can reduce allergen levels. Giving your dog or cat a bath at least once a week can reduce airborne allergen. Control of Mold Allergen   Mold and fungi can grow on a variety of surfaces provided certain temperature and moisture conditions exist.  Outdoor molds grow on plants, decaying vegetation and soil.  The major outdoor mold, Alternaria and Cladosporium, are found in very high numbers during hot and dry conditions.  Generally, a late Summer - Fall peak is seen for common outdoor fungal spores.  Rain will temporarily lower outdoor mold spore count, but counts rise rapidly when the rainy period ends.  The most important indoor molds are Aspergillus and Penicillium.  Dark, humid and poorly ventilated basements are ideal sites for mold growth.  The next most common sites of mold growth are the bathroom and the kitchen.  Outdoor (Seasonal) Mold Control  Use air conditioning and keep windows closed Avoid exposure to decaying vegetation. Avoid leaf raking. Avoid  grain handling. Consider wearing a face mask if working in moldy areas.    Indoor (Perennial) Mold Control   Maintain humidity below 50%. Clean washable surfaces with 5% bleach solution. Remove sources e.g. contaminated carpets.

## 2022-06-06 ENCOUNTER — Ambulatory Visit (HOSPITAL_BASED_OUTPATIENT_CLINIC_OR_DEPARTMENT_OTHER)
Admission: RE | Admit: 2022-06-06 | Discharge: 2022-06-06 | Disposition: A | Discharge status: Home / Self Care | Payer: BC Managed Care – PPO | Source: Ambulatory Visit | Attending: Pulmonary Disease | Admitting: Pulmonary Disease

## 2022-06-06 DIAGNOSIS — D869 Sarcoidosis, unspecified: Secondary | ICD-10-CM | POA: Diagnosis present

## 2022-06-10 ENCOUNTER — Other Ambulatory Visit (HOSPITAL_BASED_OUTPATIENT_CLINIC_OR_DEPARTMENT_OTHER): Payer: Self-pay | Admitting: Pulmonary Disease

## 2022-06-10 MED ORDER — PREDNISONE 10 MG PO TABS: 20.0000 mg | ORAL_TABLET | Freq: Every day | ORAL | 0 refills | Status: DC

## 2022-06-10 NOTE — Telephone Encounter (Signed)
Provided two weeks of prednisone 20 mg daily. Please let patient know that I changed it from 20 mg tablets to 10 mg. So she will need to take 2 tablets a day.

## 2022-06-10 NOTE — Telephone Encounter (Signed)
Please advise 

## 2022-06-12 NOTE — Progress Notes (Signed)
Follow Up Note  RE: Faith Willis MRN: [id number] DOB: 14-Feb-1976 Date of Office Visit: 06/13/2022  Referring provider: No ref. provider found Primary care provider: Alfredia Ferguson, PA  Chief Complaint: Food/Drug Challenge (bactrim)  Assessment and Plan: Faith Willis is a 47 y.o. 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  --Allergy list updated accordingly  For next 24 hours monitor for hives, swelling, shortness of breath and dizziness. If you see these symptoms, use Benadryl for mild symptoms and epinephrine for more severe symptoms and call 911.   History of Present Illness: I had the pleasure of seeing Faith Willis for a follow up visit at the [facility] on 06/13/2022. She is a 47 y.o. female, who is being followed for history of sulfa allergy needing PJP ppx and environmental allergies. Her previous allergy office visit was on 05/23/22 with Dr. Simona Huh. Today she is here for bactrim drug testing and challenge.   History of Reaction: she remembers having a rash after sulfas as a kid. She does not remember anything else about her reaction.  This was 40 years ago.  She has avoided ever since.  Interval History: Patient has not been ill, she has not had any accidental exposures to the culprit medication.   Recent/Current History: Pulmonary disease: no Cardiac disease: no Respiratory infection: no Rash: no Itch: no Swelling: no Cough: no Shortness of breath: no Runny/stuffy nose: no Itchy eyes: no Beta-blocker use: no  Patient/guardian was informed of the test procedure with verbalized understanding of the risk of anaphylaxis. Consent was signed.   Last antihistamine use > 3 days  Medication List:  Current Outpatient Medications  Medication Sig Dispense Refill   albuterol (VENTOLIN HFA) 108 (90 Base) MCG/ACT inhaler Inhale 2  puffs into the lungs every 6 (six) hours as needed for wheezing or shortness of breath. 18 g 1   ascorbic acid (VITAMIN C) 100 MG tablet Take by mouth.     Azelastine HCl 0.15 % SOLN Place 1 spray into both nostrils 2 (two) times daily as needed. 30 mL 5   calcium-vitamin D 250-100 MG-UNIT tablet Take 1 tablet by mouth 2 (two) times daily.     cetirizine (ZYRTEC) 10 MG tablet Take by mouth.     diphenhydrAMINE (BENADRYL) 25 mg capsule Take by mouth.     Fluticasone Furoate (ARNUITY ELLIPTA) 200 MCG/ACT AEPB Inhale 1 puff into the lungs daily. 30 each 5   losartan (COZAAR) 50 MG tablet Take by mouth.     Multiple Vitamin (MULTIVITAMIN) capsule Take 1 capsule by mouth daily.     omeprazole (PRILOSEC) 20 MG capsule Take 1 capsule (20 mg total) by mouth daily.     predniSONE (DELTASONE) 10 MG tablet Take 2 tablets (20 mg total) by mouth daily with breakfast for 14 days. 28 tablet 0   sulfamethoxazole-trimethoprim (BACTRIM) 200-40 MG/5ML suspension Do NOT TAKE-Bring to Allergy clinic for challenge. 100 mL 0   No current facility-administered medications for this visit.   Allergies: Allergies  Allergen Reactions   Cefuroxime Diarrhea and Nausea And Vomiting    diarrhea diarrhea diarrhea diarrhea    Hydrocodone-Acetaminophen     Other reaction(s): Dizziness Passed out Passed out Passed out Passed out    I reviewed her past medical history, social history, family history, and environmental history and no significant changes have been reported from her previous visit.  Review  of Systems-negative except as per HPI. Objective: BP 120/70   Pulse 94   Temp (!) 97.2 F (36.2 C) (Temporal)   Resp 16   SpO2 97%  There is no height or weight on file to calculate BMI.  General Appearance:  Alert, cooperative, no distress, appears stated age  Head:  Normocephalic, without obvious abnormality, atraumatic  Eyes:  Conjunctiva clear, EOM's intact  Nose: Nares normal  Throat: Lips, tongue  normal; teeth and gums normal, normal posterior oropharynx  Neck: Supple, symmetrical  Lungs:   CTAB, Respirations unlabored, no coughing  Heart:  RRR, no murmur, Appears well perfused  Extremities: No edema  Skin: Skin color, texture, turgor normal, no rashes or lesions on visualized portions of skin  Neurologic: No gross deficits     Diagnostics:  Patient given 1 mL of TMP-SMX 200 mg-40 mg/79mL. Waited one hour. No symptoms Patient given 9 mL of TMP-SMX 200 mg-40 mg/81mL. Waited two hours. No symptoms.   Oral Challenge - 06/13/22 0900     Challenge Food/Drug bactrim    Food/Drug provided by pt    Time 0913    Dose 65ml    Time 1014    Dose 76ml    Additional Dose Yes    Time 1215    Dose 2 hr wait    BP 124/64    Pulse 102    Respirations 20             Previous notes and tests were reviewed. The plan was reviewed with the patient/family, and all questions/concerned were addressed.  It was my pleasure to see Faith Willis today and participate in her care. Please feel free to contact me with any questions or concerns.  Sigurd Sos, MD [facility]

## 2022-06-13 ENCOUNTER — Encounter: Payer: Self-pay | Admitting: Internal Medicine

## 2022-06-13 ENCOUNTER — Ambulatory Visit (INDEPENDENT_AMBULATORY_CARE_PROVIDER_SITE_OTHER): Payer: BC Managed Care – PPO | Admitting: Internal Medicine

## 2022-06-13 VITALS — BP 120/70 | HR 94 | Temp 97.2°F | Resp 16

## 2022-06-13 DIAGNOSIS — Z882 Allergy status to sulfonamides status: Secondary | ICD-10-CM | POA: Diagnosis not present

## 2022-06-13 DIAGNOSIS — T886XXD Anaphylactic reaction due to adverse effect of correct drug or medicament properly administered, subsequent encounter: Secondary | ICD-10-CM

## 2022-06-19 ENCOUNTER — Ambulatory Visit (INDEPENDENT_AMBULATORY_CARE_PROVIDER_SITE_OTHER): Payer: BC Managed Care – PPO | Admitting: Pulmonary Disease

## 2022-06-19 ENCOUNTER — Encounter (HOSPITAL_BASED_OUTPATIENT_CLINIC_OR_DEPARTMENT_OTHER): Payer: Self-pay | Admitting: Pulmonary Disease

## 2022-06-19 VITALS — BP 120/64 | HR 93 | Temp 98.5°F | Ht 68.0 in | Wt 219.0 lb

## 2022-06-19 DIAGNOSIS — Z79899 Other long term (current) drug therapy: Secondary | ICD-10-CM | POA: Diagnosis not present

## 2022-06-19 DIAGNOSIS — D869 Sarcoidosis, unspecified: Secondary | ICD-10-CM | POA: Diagnosis not present

## 2022-06-19 MED ORDER — ARNUITY ELLIPTA 200 MCG/ACT IN AEPB: 1.0000 | INHALATION_SPRAY | Freq: Every day | RESPIRATORY_TRACT | 5 refills | Status: DC

## 2022-06-19 MED ORDER — PREDNISONE 10 MG PO TABS: 10.0000 mg | ORAL_TABLET | Freq: Every day | ORAL | 1 refills | Status: DC

## 2022-06-19 NOTE — Patient Instructions (Signed)
Sarcoidosis Chronic cough - resolved --CONTINUE Arnuity ONE puff daily --CONTINUE Albuterol as needed for shortness of breath, wheezing and cough --ORDER CT Chest without contrast in Sept 2024  High risk medication monitoring --CONTINUE prednisone 10 mg daily --Continue omeprazole 20 mg daily for ulcer prevention with steroids  Osteoporosis prevention in setting of chronic steroid use If you are taking >2.5 mg of prednisone for >3 month regardless of risk fracture or have moderate-high risk fracture: --Recommend lifestyple modifications (e.g. balanced diet, smoking cessation, moderate alcohol intake and regular weight bearing or resistance exercises) --Recommend calcium 1000 mg and and vitamin D 800U

## 2022-06-19 NOTE — Progress Notes (Signed)
Subjective:   PATIENT ID: Faith Willis GENDER: female DOB: [birth year], MRN: [id number]   HPI  Chief Complaint  Patient presents with   Follow-up    Pt is here for follow up for sarcoidosis. Pt states she is doing well with the arnuity inhaler and can tell the difference if she misses a day. She states she does not use the albuterol that much. Pt has questions about the 20mg  prednisone.    Reason for Visit: Follow-up  [name] is a 47 year old female never smoker with sarcoid, allergic rhinitis and reflux who presents for follow-up.  Synopsis: She was previously followed by Dr. Chase Caller at [facility]. Prior to this she was seen at [facility] in [place] from 2020-2021 for calcified pulmonary nodules and mediastinal adenopathy. She was asymptomatic and had normal PFTs. Underwent EBUS 02/08/2019 that was nondiagnostic. In 02/27/2019, she was diagnosed with COVID. Had worsening symptoms and CTA 03/19/19 demonstrated progression of mediastinal and hilar adenopathy and bronchus intermedius obliteration. 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 She was started on steroid taper in 2021. Was on low-dose prednisone in April and off bactrim. Never been on biologics. She moved to [facility] in summer 2021 and established care with [facility] in March 2022.  04/15/21 Since her last visit with Dr. Chase Caller, she was started on Arunity. Improvement on Arnuity by 50%. Still have episodes of cough but not as severe. Cough occurs with rest and not with activity. No shortness of breath or wheezing. Denies chest pain. No LE swelling.    05/31/21 Since her last visit, she was started on Symbicort for cough however this was discontinued due to reported elevated blood pressure readings.  After stopping Symbicort for 4 days she called our office to report continued elevated blood pressure readings.  She also had complaints of chest heaviness that  prompted her to visit the ED on 05/25/2021.  ED note reviewed and demonstrated negative work-up with troponin less than 3 and normal CBC and c-Met.  Chest x-ray with no acute abnormalities, noted nodular bilateral perihilar region.  EKG with heart rate 83 and no signs for ischemia per report. Since stopping Symbicort her cough which was previously resolved has now returned. She continues to have hypertension with SBP in the 150s despite stopping the Symbicort >10 days ago. She reports family hx significant for hypertension.  12/13/21 She is currently on Arnuity which improved her coughing and wheezing and chest heaviness. Did not tolerate Symbicort and Spiriva. Previously used to do boot camp. Currently in spin class and weight classes. Only needs albuterol with heavy exercise. She is trying to walk more. She has unchanged night sweats that soak her.  03/07/22 Compliant with her prednisone and Arnuity. Cough has completely resolved. No concerns or compliants at this time. Completed PFTs and wanting to discuss CT scan  06/19/22 Since our last visit she reports she has done well. Compliant with Arnuity and prednisone 20 mg daily. Cough has completely resolved. No shortness of breath or wheezing. Prednisone does seem to make her active but otherwise tolerable. Has fatigue but wakes up early every day. She exercises daily including weights and cardio 30 min six days a week  Prior inhalers: Symbicort - elevated BP Spiriva - ineffective   Social History: Never smoker  Past Medical History:  Diagnosis Date   Acid reflux    Allergic rhinitis    Arthritis    Sarcoid     Family History  Problem Relation  Age of Onset   Diabetes Other      Social History   Occupational History   Not on file  Tobacco Use   Smoking status: Never   Smokeless tobacco: Never  Substance and Sexual Activity   Alcohol use: Not on file   Drug use: Not on file   Sexual activity: Not on file    Allergies  Allergen  Reactions   Cefuroxime Diarrhea and Nausea And Vomiting    diarrhea diarrhea diarrhea diarrhea    Hydrocodone-Acetaminophen     Other reaction(s): Dizziness Passed out Passed out Passed out Passed out      Outpatient Medications Prior to Visit  Medication Sig Dispense Refill   albuterol (VENTOLIN HFA) 108 (90 Base) MCG/ACT inhaler Inhale 2 puffs into the lungs every 6 (six) hours as needed for wheezing or shortness of breath. 18 g 1   ascorbic acid (VITAMIN C) 100 MG tablet Take by mouth.     Azelastine HCl 0.15 % SOLN Place 1 spray into both nostrils 2 (two) times daily as needed. 30 mL 5   calcium-vitamin D 250-100 MG-UNIT tablet Take 1 tablet by mouth 2 (two) times daily.     cetirizine (ZYRTEC) 10 MG tablet Take by mouth.     Fluticasone Furoate (ARNUITY ELLIPTA) 200 MCG/ACT AEPB Inhale 1 puff into the lungs daily. 30 each 5   losartan (COZAAR) 50 MG tablet Take by mouth.     Melatonin 5 MG CAPS Take by mouth.     Multiple Vitamin (MULTIVITAMIN) capsule Take 1 capsule by mouth daily.     omeprazole (PRILOSEC) 20 MG capsule Take 1 capsule (20 mg total) by mouth daily.     predniSONE (DELTASONE) 10 MG tablet Take 2 tablets (20 mg total) by mouth daily with breakfast for 14 days. 28 tablet 0   sulfamethoxazole-trimethoprim (BACTRIM) 200-40 MG/5ML suspension Do NOT TAKE-Bring to Allergy clinic for challenge. 100 mL 0   diphenhydrAMINE (BENADRYL) 25 mg capsule Take by mouth.     No facility-administered medications prior to visit.    Review of Systems  Constitutional:  Positive for malaise/fatigue. Negative for chills, diaphoresis, fever and weight loss.  HENT:  Negative for congestion.   Respiratory:  Negative for cough, hemoptysis, sputum production, shortness of breath and wheezing.   Cardiovascular:  Negative for chest pain, palpitations and leg swelling.     Objective:   Vitals:   06/19/22 0848  BP: 120/64  Pulse: 93  Temp: 98.5 F (36.9 C)  TempSrc: Oral   SpO2: 98%  Weight: 219 lb (99.3 kg)  Height: 5\' 8"  (1.727 m)   SpO2: 98 % O2 Device: None (Room air)  Physical Exam: General: Well-appearing, no acute distress HENT: , AT Eyes: EOMI, no scleral icterus Respiratory: Clear to auscultation bilaterally.  No crackles, wheezing or rales Cardiovascular: RRR, -M/R/G, no JVD Extremities:-Edema,-tenderness Neuro: AAO x4, CNII-XII grossly intact Psych: Normal mood, normal affect  Data Reviewed:  Imaging: CT Chest 07/17/20 Mediastinal and hilar parenchymal findings of sarcoid. RML bronchus narrowing. Peripheral right lower lobe nodule measuring 108mm CT chest 05/14/2021-mildly enlarged bilateral hilar and mediastinal nodes.  Right lower lobe nodule 1mm and 8 mm, previously 9 and 8 respectively.  Perihilar nodule in the right middle lobe enlarged from 9 to 1.3 cm. CT Chest 12/06/21 - Innumerable pulmonary nodules, increased in right upper and lower lung. Unchanged size in nodules with largest 13 mm. Enlarged mediastinal and hilar lymph nodes CT Chest 03/07/22 - Improved pulmonary nodules in  size and density CT Chest 06/06/22 - Improved bilateral peribronchovascular nodularity. Decreased density in previously seen masses  PFT: 07/17/20 FVC 3.45 (82%) FEV1 2.8 (83%) Ratio 76  TLC 92% DLCO 112% Interpretation: Normal PFTs. No significant BD response  04/15/20 FVC 3.35 (80%) FEV1 2.65 (79%) Ratio 74  TLC 95% DLCO 96% Interpretation: Normal PFTs. No significant change in FVC and FEV1. No significant BD response.  03/07/22 FVC 3.35 (80%) FEV1 2.69 (81%) Ratio 76  TLC 92% DLCO 101% Interpretation: Normal PFTs. Stable   Labs: CBC    Component Value Date/Time   WBC 6.2 05/09/2021 1638   RBC 4.90 05/09/2021 1638   HGB 14.3 05/09/2021 1638   HCT 44.4 05/09/2021 1638   PLT 322 05/09/2021 1638   MCV 91 05/09/2021 1638   MCH 29.2 05/09/2021 1638   MCHC 32.2 05/09/2021 1638   RDW 13.2 05/09/2021 1638   LYMPHSABS 1.4 05/09/2021 1638   EOSABS  0.2 05/09/2021 1638   BASOSABS 0.0 05/09/2021 1638   BMET    Component Value Date/Time   NA 140 05/09/2021 1638   K 4.4 05/09/2021 1638   CL 102 05/09/2021 1638   CO2 24 05/09/2021 1638   GLUCOSE 121 (H) 05/09/2021 1638   BUN 16 05/09/2021 1638   CREATININE 0.81 05/09/2021 1638   CALCIUM 8.9 05/09/2021 1638   EGFR 92 05/09/2021 1638   EKG/Echo: EKG 04/05/19 - Incomplete RBBB per report Echo - 05/14/2021 - EF 60 to 65%.  No WMA.  Normal diastolic.  Normal RV size and function.  No valvular abnormalities EKG 05/24/2021 - normal sinus rhythm.  Heart rate 83 PR interval 166 QTc 455  Pathology: [facility] AP CASE REPORT:Surgical Pathology                                Case: [id number]                                  Authorizing Provider:  Dalbert Batman, MD   Collected:           03/21/2019 1138              Ordering Location:     [place]:            03/21/2019 [place]                                     Operating Room                                                              Pathologist:           Gean Maidens, MD                                                        Intraop:  Gean Maidens, MD                                                        Specimens:   A) - Lung, left lower lobe biopsy                                                                   B) - Lung, bronchus intermedius                                                                     C) - Lung, left upper lobe bipsy                                                                   D) - Trachea, tracheal biopsy                                                                       E) - Mediastinum , proximal 4R                                                                     F) - Lung, bronchus intermediate                                                      [facility] [facility] AP PRE-OPERATIVE DIAGNOSIS:Lymphadenopathy  [facility] AP PATHOLOGY PROCEDURES:Bronchoscopy,  cervical mediastinoscopy, EBUS, EGD  Bronchoscopy w/EBUS  EGD  [facility] AP FINAL DIAGNOSIS:  A. LEFT LOWER LOBE BIOPSY:   BRONCHIAL MUCOSA SHOWING GRANULATION TISSUE AND CHRONIC ACTIVE  INFLAMMATION.   B. BRONCHUS INTERMEDIUS, BIOPSY:   ULCERATED BRONCHIAL MUCOSA SHOWING GRANULATION TISSUE AND CHRONIC ACTIVE  INFLAMMATION.   NO ACID FAST OR FUNGAL ORGANISMS SEEN ON AFB, GMS, PAS AND MUCICARMINE  STAINS.     C.  LEFT UPPER LOBE BIOPSY:   BRONCHIAL MUCOSA SHOWING GRANULATION TISSUE AND CHRONIC ACTIVE  INFLAMMATION.   D.  TRACHEA, BIOPSY:   ULCERATED  BRONCHIAL MUCOSA SHOWING GRANULATION TISSUE AND CHRONIC ACTIVE  INFLAMMATION.   E.  PROXIMAL 4R, EBUS-GUIDED BIOPSY:   LYMPH NODE TISSUE SHOWING CONFLUENT WELL FORMED EPITHELIOID GRANULOMAS,  FOCALLY NECROTIZING.   NO ACID FAST OR FUNGAL ORGANISMS SEEN ON AFB AND GMS STAINS.   NEGATIVE FOR TUMOR.   NOTE: THE APPEARANCE IS CONSISTENT WITH SARCOIDOSIS, HOWEVER, AN  INFECTIOUS PROCESS IS ALSO IN THE DIFFERENTIAL DIAGNOSIS.   F.  BRONCHUS INTERMEDIUS, BIOPSY:   ULCERATED BRONCHIAL MUCOSA SHOWING GRANULATION TISSUE AND CHRONIC ACTIVE  INFLAMMATION.   Note: The airway biopsies show no evidence of malignancy and no viral  cytopathic effect is identified.     The case was referred to [facility] for  consultation.  Their report follows:   PATHOLOGIC DIAGNOSIS:  Consult slides from [facility], [place], [place]  [phone];  03/21/2019):   A.  Lung, left lower lobe, biopsy:    Bronchial mucosa with granulation tissue formation and admixed mixed  acute and chronic inflammation.   B. Lung, bronchus intermedius, frozen section biopsy:   Ulcerated bronchial mucosa with granulation tissue formation, admixed  mixed acute and chronic inflammation, fibrinopurulent exudate, and focus  of histiocytic aggregates.   Received stains for AFB, GMS, PAS, and mucicarmine are negative.   C.  Lung,  left upper lobe, biopsy:   Inflamed bronchial mucosa with granulation tissue formation and admixed  mixed acute and chronic inflammation.   D.  Trachea, biopsy:   Ulcerated bronchial mucosa with granulation tissue formation, admixed  mixed acute and chronic inflammation, fibrinopurulent exudate and focus of  histiocytic aggregates.   E.  Mediastinum, lymph node, proximal 4R, biopsy:   Lymph node with confluent well-formed, focally necrotizing granulomas with  hyalinization, see note.   Note:  Received stains for GMS and AFB are negative for micro-organisms.    The differential diagnosis includes sarcoidosis and an infectious process,  clinical correlation recommended.   F.  Lung, bronchus intermediate, biopsy:   Ulcerated bronchial mucosa with granulation tissue formation, admixed  mixed acute and chronic inflammation, and fibrinopurulent exudate.   Electronically signed out:  Dawson Bills, M.D.   Date:  04/01/2019  [facility] Department of Pathology     Assessment & Plan:   Discussion: 47 year old female never smoker with sarcoid, allergic rhinitis and reflux who presents for follow-up for sarcoid flare. On prednisone with improved symptoms and radiographic findings. Previously discussed advancing to methotrexate. After discussion patient preferred prolonged steroid taper. CT reviewed and continues to improve. Will plan to start prednisone taper. If she needs to be titrated up at any point, will need PCP ppx with bactrim (now able to take per Allergy).   Adverse effects of steroids discussed including bruising, weight gain, fluid retention, Cushingoid features, hypertension, cardiac arrhythmias, osteoporosis, gastric ulcers, increased risk of infection, insomnia and hyperglycemia.  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 Last 02/2022 and stable  - Recommend annual ophthalmology exam.  Last  visit on 2021. REFER to optho - Reviewed echo 04/2021 and EKG: Normal  Chronic cough - resolved --CONTINUE Arnuity ONE puff daily --Failed Symbicort and Spiriva: Possible ADE with hypertension --CONTINUE Albuterol as needed for shortness of breath, wheezing and cough  High risk medication monitoring --Reviewed CT Chest with improving sarcoid - Previously completed taper in 2021 - Restart taper 11/2021 at 20 mg > 06/19/22 10 mg --CONTINUE prednisone 10 mg daily --ORDER CT  Chest without contrast in Sept 2024 --Continue omeprazole 20 mg daily for ulcer prevention with steroids  Osteoporosis prevention in setting of chronic steroid use If you are taking >2.5 mg of prednisone for >3 month regardless of risk fracture or have moderate-high risk fracture: --Recommend lifestyple modifications (e.g. balanced diet, smoking cessation, moderate alcohol intake and regular weight bearing or resistance exercises) --Recommend calcium 1000 mg and and vitamin D 800U    Health Maintenance Immunization History  Administered Date(s) Administered   Influenza, Seasonal, Injecte, Preservative Fre 12/31/2016, 12/21/2020   Influenza,inj,Quad PF,6+ Mos 12/31/2016, 12/21/2020, 12/13/2021   Influenza-Unspecified 12/31/2016, 11/23/2019, 11/23/2019, 12/21/2020   Moderna Sars-Covid-2 Vaccination 04/22/2019, 04/22/2019, 05/20/2019, 11/23/2019, 11/23/2019, 12/14/2019   Tdap 07/08/2016   CT Lung Screen- not qualified. Never smoker  Orders Placed This Encounter  Procedures   CT Chest Wo Contrast    Standing Status:   Future    Standing Expiration Date:   06/19/2023    Scheduling Instructions:     Schedule in 6 months (September 2024)    Order Specific Question:   Is patient pregnant?    Answer:   No    Order Specific Question:   Preferred imaging location?    Answer:   [facility]   Meds ordered this encounter  Medications   predniSONE (DELTASONE) 10 MG tablet    Sig: Take 1 tablet (10 mg total) by  mouth daily with breakfast.    Dispense:  90 tablet    Refill:  1   Fluticasone Furoate (ARNUITY ELLIPTA) 200 MCG/ACT AEPB    Sig: Inhale 1 puff into the lungs daily.    Dispense:  30 each    Refill:  5   Return in about 6 months (around 12/20/2022) for after CT scan.  I have spent a total time of 39-minutes on the day of the appointment including chart review, data review, collecting history, coordinating care and discussing medical diagnosis and plan with the patient/family. Past medical history, allergies, medications were reviewed. Pertinent imaging, labs and tests included in this note have been reviewed and interpreted independently by me.  [place], MD [facility] 06/19/2022 9:23 AM  Office Number [phone]

## 2022-07-15 ENCOUNTER — Ambulatory Visit: Payer: BC Managed Care – PPO

## 2022-07-22 ENCOUNTER — Encounter (HOSPITAL_BASED_OUTPATIENT_CLINIC_OR_DEPARTMENT_OTHER): Payer: Self-pay | Admitting: Pulmonary Disease

## 2022-07-29 NOTE — Telephone Encounter (Signed)
Attempted to contact patient. No answer. Left message to call back.

## 2022-09-30 ENCOUNTER — Encounter (HOSPITAL_BASED_OUTPATIENT_CLINIC_OR_DEPARTMENT_OTHER): Payer: Self-pay | Admitting: Pulmonary Disease

## 2022-10-01 MED ORDER — PREDNISONE 10 MG PO TABS: 10.0000 mg | ORAL_TABLET | Freq: Every day | ORAL | 1 refills | Status: DC

## 2022-12-05 ENCOUNTER — Ambulatory Visit (HOSPITAL_BASED_OUTPATIENT_CLINIC_OR_DEPARTMENT_OTHER)
Admission: RE | Admit: 2022-12-05 | Discharge: 2022-12-05 | Disposition: A | Discharge status: Home / Self Care | Payer: BC Managed Care – PPO | Source: Ambulatory Visit | Attending: Physician Assistant | Admitting: Physician Assistant

## 2022-12-05 DIAGNOSIS — D869 Sarcoidosis, unspecified: Secondary | ICD-10-CM | POA: Diagnosis present

## 2022-12-11 ENCOUNTER — Encounter (HOSPITAL_BASED_OUTPATIENT_CLINIC_OR_DEPARTMENT_OTHER): Payer: Self-pay | Admitting: Pulmonary Disease

## 2022-12-17 ENCOUNTER — Encounter (HOSPITAL_BASED_OUTPATIENT_CLINIC_OR_DEPARTMENT_OTHER): Payer: Self-pay | Admitting: Pulmonary Disease

## 2022-12-17 ENCOUNTER — Ambulatory Visit (HOSPITAL_BASED_OUTPATIENT_CLINIC_OR_DEPARTMENT_OTHER): Payer: BC Managed Care – PPO | Admitting: Pulmonary Disease

## 2022-12-17 VITALS — BP 120/72 | HR 104 | Resp 16 | Ht 68.0 in | Wt 239.5 lb

## 2022-12-17 DIAGNOSIS — D869 Sarcoidosis, unspecified: Secondary | ICD-10-CM

## 2022-12-17 MED ORDER — PREDNISONE 5 MG PO TABS: 5.0000 mg | ORAL_TABLET | Freq: Every day | ORAL | 5 refills | Status: DC

## 2022-12-17 MED ORDER — ARNUITY ELLIPTA 200 MCG/ACT IN AEPB: 1.0000 | INHALATION_SPRAY | Freq: Every day | RESPIRATORY_TRACT | 5 refills | Status: DC

## 2022-12-17 NOTE — Progress Notes (Signed)
Subjective:   PATIENT ID: Faith Willis GENDER: female DOB: 08-27-1975, MRN: [id number]   HPI  Chief Complaint  Patient presents with   Follow-up    Sarcoid on prednisone   Reason for Visit: Follow-up  [name] is a 47 year old female never smoker with sarcoid, allergic rhinitis and reflux who presents for follow-up.  Synopsis: She was previously followed by Dr. Marchelle Gearing at [facility]. Prior to this she was seen at [facility] in [place] from 2020-2021 for calcified pulmonary nodules and mediastinal adenopathy. She was asymptomatic and had normal PFTs. Underwent EBUS 02/08/2019 that was nondiagnostic. In 02/27/2019, she was diagnosed with COVID. Had worsening symptoms and CTA 03/19/19 demonstrated progression of mediastinal and hilar adenopathy and bronchus intermedius obliteration. 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 She was started on steroid taper in 2021. Was on low-dose prednisone in April and off bactrim. Never been on biologics. She moved to [facility] in summer 2021 and established care with [facility] in March 2022.  04/15/21 Since her last visit with Dr. Marchelle Gearing, she was started on Arunity. Improvement on Arnuity by 50%. Still have episodes of cough but not as severe. Cough occurs with rest and not with activity. No shortness of breath or wheezing. Denies chest pain. No LE swelling.    05/31/21 Since her last visit, she was started on Symbicort for cough however this was discontinued due to reported elevated blood pressure readings.  After stopping Symbicort for 4 days she called our office to report continued elevated blood pressure readings.  She also had complaints of chest heaviness that prompted her to visit the ED on 05/25/2021.  ED note reviewed and demonstrated negative work-up with troponin less than 3 and normal CBC and c-Met.  Chest x-ray with no acute abnormalities, noted nodular bilateral perihilar  region.  EKG with heart rate 83 and no signs for ischemia per report. Since stopping Symbicort her cough which was previously resolved has now returned. She continues to have hypertension with SBP in the 150s despite stopping the Symbicort >10 days ago. She reports family hx significant for hypertension.  12/13/21 She is currently on Arnuity which improved her coughing and wheezing and chest heaviness. Did not tolerate Symbicort and Spiriva. Previously used to do boot camp. Currently in spin class and weight classes. Only needs albuterol with heavy exercise. She is trying to walk more. She has unchanged night sweats that soak her.  03/07/22 Compliant with her prednisone and Arnuity. Cough has completely resolved. No concerns or compliants at this time. Completed PFTs and wanting to discuss CT scan  06/19/22 Since our last visit she reports she has done well. Compliant with Arnuity and prednisone 20 mg daily. Cough has completely resolved. No shortness of breath or wheezing. Prednisone does seem to make her active but otherwise tolerable. Has fatigue but wakes up early every day. She exercises daily including weights and cardio 30 min six days a week  12/17/22 Since our last visit she reports from a respiratory standpoint she has had some shortness of breath with exercise. Exercises 5x a week with cardio and weight and hour daily. Does walking videos and treadmill for cardio portion.  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 [place]  Past Medical History:  Diagnosis Date   Acid reflux    Allergic rhinitis    Arthritis    Sarcoid  Family History  Problem Relation Age of Onset   Diabetes Other      Social History   Occupational History   Not on file  Tobacco Use   Smoking status: Never    Passive exposure: Past   Smokeless tobacco: Never  Substance and Sexual Activity   Alcohol use: Not  on file   Drug use: Not on file   Sexual activity: Not on file    Allergies  Allergen Reactions   Cefuroxime Diarrhea and Nausea And Vomiting    diarrhea diarrhea diarrhea diarrhea    Hydrocodone-Acetaminophen     Other reaction(s): Dizziness Passed out Passed out Passed out Passed out      Outpatient Medications Prior to Visit  Medication Sig Dispense Refill   albuterol (VENTOLIN HFA) 108 (90 Base) MCG/ACT inhaler Inhale 2 puffs into the lungs every 6 (six) hours as needed for wheezing or shortness of breath. 18 g 1   ascorbic acid (VITAMIN C) 100 MG tablet Take by mouth.     Azelastine HCl 0.15 % SOLN Place 1 spray into both nostrils 2 (two) times daily as needed. 30 mL 5   calcium-vitamin D 250-100 MG-UNIT tablet Take 1 tablet by mouth 2 (two) times daily.     cetirizine (ZYRTEC) 10 MG tablet Take by mouth.     hydrochlorothiazide (HYDRODIURIL) 12.5 MG tablet Take 12.5 mg by mouth daily.     losartan (COZAAR) 100 MG tablet Take 100 mg by mouth daily.     Melatonin 5 MG CAPS Take by mouth.     Multiple Vitamin (MULTIVITAMIN) capsule Take 1 capsule by mouth daily.     omeprazole (PRILOSEC) 20 MG capsule Take 1 capsule (20 mg total) by mouth daily.     sulfamethoxazole-trimethoprim (BACTRIM) 200-40 MG/5ML suspension Do NOT TAKE-Bring to Allergy clinic for challenge. 100 mL 0   Fluticasone Furoate (ARNUITY ELLIPTA) 200 MCG/ACT AEPB Inhale 1 puff into the lungs daily. 30 each 5   predniSONE (DELTASONE) 10 MG tablet Take 1 tablet (10 mg total) by mouth daily with breakfast. 90 tablet 1   predniSONE (DELTASONE) 10 MG tablet Take 1 tablet (10 mg total) by mouth daily with breakfast. 90 tablet 1   losartan (COZAAR) 50 MG tablet Take 100 mg by mouth.     No facility-administered medications prior to visit.    Review of Systems  Constitutional:  Negative for chills, diaphoresis, fever, malaise/fatigue and weight loss.  HENT:  Negative for congestion.   Respiratory:  Positive for  shortness of breath. Negative for cough, hemoptysis, sputum production and wheezing.   Cardiovascular:  Negative for chest pain, palpitations and leg swelling.     Objective:   Vitals:   12/17/22 1548  BP: 120/72  Pulse: (!) 104  Resp: 16  SpO2: 97%  Weight: 239 lb 8 oz (108.6 kg)  Height: 5\' 8"  (1.727 m)   SpO2: 97 %  Physical Exam: General: Well-appearing, no acute distress HENT: [place], AT Eyes: EOMI, no scleral icterus Respiratory: Clear to auscultation bilaterally.  No crackles, wheezing or rales Cardiovascular: RRR, -M/R/G, no JVD Extremities:-Edema,-tenderness Neuro: AAO x4, CNII-XII grossly intact Psych: Normal mood, normal affect  Data Reviewed:  Imaging: CT Chest 07/17/20 Mediastinal and hilar parenchymal findings of sarcoid. RML bronchus narrowing. Peripheral right lower lobe nodule measuring 8mm CT chest 05/14/2021-mildly enlarged bilateral hilar and mediastinal nodes.  Right lower lobe nodule 10mm and 8 mm, previously 9 and 8 respectively.  Perihilar nodule in the right middle lobe enlarged  from 9 to 1.3 cm. CT Chest 12/06/21 - Innumerable pulmonary nodules, increased in right upper and lower lung. Unchanged size in nodules with largest 13 mm. Enlarged mediastinal and hilar lymph nodes CT Chest 03/07/22 - Improved pulmonary nodules in size and density CT Chest 06/06/22 - Improved bilateral peribronchovascular nodularity. Decreased density in previously seen masses CT Chest 12/05/22 - Stable calcified and noncalcified adenopathy in mediastinum and hilar region. Stable peribronchovascular nodularity with some areas including RML, stable 9 x6 mm RLL pulmonary nodule  PFT: 07/17/20 FVC 3.45 (82%) FEV1 2.8 (83%) Ratio 76  TLC 92% DLCO 112% Interpretation: Normal PFTs. No significant BD response  04/15/20 FVC 3.35 (80%) FEV1 2.65 (79%) Ratio 74  TLC 95% DLCO 96% Interpretation: Normal PFTs. No significant change in FVC and FEV1. No significant BD response.  03/07/22 FVC  3.35 (80%) FEV1 2.69 (81%) Ratio 76  TLC 92% DLCO 101% Interpretation: Normal PFTs. Stable   Labs: CBC    Component Value Date/Time   WBC 6.2 05/09/2021 1638   RBC 4.90 05/09/2021 1638   HGB 14.3 05/09/2021 1638   HCT 44.4 05/09/2021 1638   PLT 322 05/09/2021 1638   MCV 91 05/09/2021 1638   MCH 29.2 05/09/2021 1638   MCHC 32.2 05/09/2021 1638   RDW 13.2 05/09/2021 1638   LYMPHSABS 1.4 05/09/2021 1638   EOSABS 0.2 05/09/2021 1638   BASOSABS 0.0 05/09/2021 1638   BMET    Component Value Date/Time   NA 140 05/09/2021 1638   K 4.4 05/09/2021 1638   CL 102 05/09/2021 1638   CO2 24 05/09/2021 1638   GLUCOSE 121 (H) 05/09/2021 1638   BUN 16 05/09/2021 1638   CREATININE 0.81 05/09/2021 1638   CALCIUM 8.9 05/09/2021 1638   EGFR 92 05/09/2021 1638   EKG/Echo: EKG 04/05/19 - Incomplete RBBB per report Echo - 05/14/2021 - EF 60 to 65%.  No WMA.  Normal diastolic.  Normal RV size and function.  No valvular abnormalities EKG 05/24/2021 - normal sinus rhythm.  Heart rate 83 PR interval 166 QTc 455  Pathology: [facility] LAB AP CASE REPORT:Surgical Pathology                                Case: [id number]                                  Authorizing Provider:  Toma Deiters, MD   Collected:           03/21/2019 1138              Ordering Location:     [facility] -     Received:            03/21/2019 1159                                     Operating Room                                                              Pathologist:  Colin Broach, MD                                                        Intraop:               Colin Broach, MD                                                        Specimens:   A) - Lung, left lower lobe biopsy                                                                   B) - Lung, bronchus intermedius                                                                     C) - Lung, left upper lobe bipsy                                                                    D) - Trachea, tracheal biopsy                                                                       E) - Mediastinum , proximal 4R                                                                     F) - Lung, bronchus intermediate                                                      [facility] LAB AP PRE-OPERATIVE DIAGNOSIS:Lymphadenopathy  [facility] LAB AP PATHOLOGY PROCEDURES:Bronchoscopy, cervical mediastinoscopy, EBUS, EGD  Bronchoscopy w/EBUS  EGD  [facility] LAB AP FINAL DIAGNOSIS:  A. LEFT LOWER LOBE BIOPSY:   BRONCHIAL MUCOSA SHOWING  GRANULATION TISSUE AND CHRONIC ACTIVE  INFLAMMATION.   B. BRONCHUS INTERMEDIUS, BIOPSY:   ULCERATED BRONCHIAL MUCOSA SHOWING GRANULATION TISSUE AND CHRONIC ACTIVE  INFLAMMATION.   NO ACID FAST OR FUNGAL ORGANISMS SEEN ON AFB, GMS, PAS AND MUCICARMINE  STAINS.     C.  LEFT UPPER LOBE BIOPSY:   BRONCHIAL MUCOSA SHOWING GRANULATION TISSUE AND CHRONIC ACTIVE  INFLAMMATION.   D.  TRACHEA, BIOPSY:   ULCERATED BRONCHIAL MUCOSA SHOWING GRANULATION TISSUE AND CHRONIC ACTIVE  INFLAMMATION.   E.  PROXIMAL 4R, EBUS-GUIDED BIOPSY:   LYMPH NODE TISSUE SHOWING CONFLUENT WELL FORMED EPITHELIOID GRANULOMAS,  FOCALLY NECROTIZING.   NO ACID FAST OR FUNGAL ORGANISMS SEEN ON AFB AND GMS STAINS.   NEGATIVE FOR TUMOR.   NOTE: THE APPEARANCE IS CONSISTENT WITH SARCOIDOSIS, HOWEVER, AN  INFECTIOUS PROCESS IS ALSO IN THE DIFFERENTIAL DIAGNOSIS.   F.  BRONCHUS INTERMEDIUS, BIOPSY:   ULCERATED BRONCHIAL MUCOSA SHOWING GRANULATION TISSUE AND CHRONIC ACTIVE  INFLAMMATION.   Note: The airway biopsies show no evidence of malignancy and no viral  cytopathic effect is identified.     The case was referred to [facility] for  consultation.  Their report follows:   PATHOLOGIC DIAGNOSIS:  Consult slides from [facility], [place], [place]  [phone];  03/21/2019):   A.  Lung, left lower lobe, biopsy:     Bronchial mucosa with granulation tissue formation and admixed mixed  acute and chronic inflammation.   B. Lung, bronchus intermedius, frozen section biopsy:   Ulcerated bronchial mucosa with granulation tissue formation, admixed  mixed acute and chronic inflammation, fibrinopurulent exudate, and focus  of histiocytic aggregates.   Received stains for AFB, GMS, PAS, and mucicarmine are negative.   C.  Lung, left upper lobe, biopsy:   Inflamed bronchial mucosa with granulation tissue formation and admixed  mixed acute and chronic inflammation.   D.  Trachea, biopsy:   Ulcerated bronchial mucosa with granulation tissue formation, admixed  mixed acute and chronic inflammation, fibrinopurulent exudate and focus of  histiocytic aggregates.   E.  Mediastinum, lymph node, proximal 4R, biopsy:   Lymph node with confluent well-formed, focally necrotizing granulomas with  hyalinization, see note.   Note:  Received stains for GMS and AFB are negative for micro-organisms.    The differential diagnosis includes sarcoidosis and an infectious process,  clinical correlation recommended.   F.  Lung, bronchus intermediate, biopsy:   Ulcerated bronchial mucosa with granulation tissue formation, admixed  mixed acute and chronic inflammation, and fibrinopurulent exudate.   Electronically signed out:  Angelina Pih, M.D.   Date:  04/01/2019  [facility] Department of Pathology     Assessment & Plan:   Discussion: 47 year old female never smoker with sarcoid, allergic rhinitis and reflux who presents for follow-up sarcoid flare. On prednisone with improved symptoms and radiographic findings since 11/2021. Reviewed CT which demonstrates stable, improved findings for 6 months. Will start tapering and repeat CT in 3 months. If worsening sarcoid during taper than will need to start methotrexate.   Adverse effects of steroids discussed including bruising, weight gain,  fluid retention, Cushingoid features, hypertension, cardiac arrhythmias, osteoporosis, gastric ulcers, increased risk of infection, insomnia and hyperglycemia.  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 Last 02/2022 and stable  - Recommend annual ophthalmology exam.  Last visit on 2021.   Note: 02/18/22 - [facility] [phone] has attempted to reach patient 3  times on 11/2, 11/9, 11/16 and unable to schedule appointment. - Reviewed echo 04/2021 and EKG: Normal  Chronic cough - resolved --CONTINUE Arnuity ONE puff daily. Refilled --Failed Symbicort and Spiriva: Possible ADE with hypertension --CONTINUE Albuterol as needed for shortness of breath, wheezing and cough  High risk medication monitoring --Reviewed CT Chest with improving sarcoid - Previously completed taper in 2021 - Restart taper 11/2021 at 20 mg > 06/19/22 10 mg - Taper 12/17/22 prednisone 5 mg --Decrease prednisone 5 mg with plan for three months Will alternative to every other day  >If symptoms worsened on follow-up imaging then will need to consider methotrexate --ORDER CT Chest without contrast in 3 months --Continue omeprazole 20 mg daily for ulcer prevention with steroids  Osteoporosis prevention in setting of chronic steroid use If you are taking >2.5 mg of prednisone for >3 month regardless of risk fracture or have moderate-high risk fracture: --Recommend lifestyple modifications (e.g. balanced diet, smoking cessation, moderate alcohol intake and regular weight bearing or resistance exercises) --Recommend calcium 1000 mg and and vitamin D 800U    Health Maintenance Immunization History  Administered Date(s) Administered   Influenza, Seasonal, Injecte, Preservative Fre 12/31/2016, 12/21/2020   Influenza,inj,Quad PF,6+ Mos 12/31/2016, 12/21/2020, 12/13/2021   Influenza-Unspecified 12/31/2016, 11/23/2019, 12/21/2020, 12/13/2022   Moderna Sars-Covid-2  Vaccination 04/22/2019, 04/22/2019, 05/20/2019, 11/23/2019, 11/23/2019, 12/14/2019   Tdap 07/08/2016   CT Lung Screen- not qualified. Never smoker  Orders Placed This Encounter  Procedures   CT Chest Wo Contrast    Standing Status:   Future    Standing Expiration Date:   12/17/2023    Scheduling Instructions:     Schedule in 3 months. At the end of December    Order Specific Question:   Is patient pregnant?    Answer:   No    Order Specific Question:   Preferred imaging location?    Answer:   [facility]   Meds ordered this encounter  Medications   predniSONE (DELTASONE) 5 MG tablet    Sig: Take 1 tablet (5 mg total) by mouth daily with breakfast.    Dispense:  30 tablet    Refill:  5   Fluticasone Furoate (ARNUITY ELLIPTA) 200 MCG/ACT AEPB    Sig: Inhale 1 puff into the lungs daily.    Dispense:  30 each    Refill:  5   Return in about 3 months (around 03/18/2023) for after CT scan. Schedule follow-up in early Janury.  I have spent a total time of 39-minutes on the day of the appointment including chart review, data review, collecting history, coordinating care and discussing medical diagnosis and plan with the patient/family. Past medical history, allergies, medications were reviewed. Pertinent imaging, labs and tests included in this note have been reviewed and interpreted independently by me.  Halia Franey Mechele Collin, MD [facility] 12/17/2022 5:00 PM  Office Number [phone]

## 2022-12-17 NOTE — Patient Instructions (Addendum)
Sarcoidosis --Decrease prednisone 5 mg with plan for three months Will alternative to every other day  >If symptoms worsened on follow-up imaging then will need to consider methotrexate --ORDER CT Chest without contrast in 3 months --Continue omeprazole 20 mg daily for ulcer prevention with steroids  Osteoporosis prevention in setting of chronic steroid use If you are taking >2.5 mg of prednisone for >3 month regardless of risk fracture or have moderate-high risk fracture: --Recommend lifestyple modifications (e.g. balanced diet, smoking cessation, moderate alcohol intake and regular weight bearing or resistance exercises) --Recommend calcium 1000 mg and and vitamin D 800U

## 2023-02-27 ENCOUNTER — Other Ambulatory Visit: Payer: Self-pay | Admitting: Medical Genetics

## 2023-03-16 ENCOUNTER — Ambulatory Visit (HOSPITAL_BASED_OUTPATIENT_CLINIC_OR_DEPARTMENT_OTHER)
Admission: RE | Admit: 2023-03-16 | Discharge: 2023-03-16 | Disposition: A | Discharge status: Home / Self Care | Payer: BC Managed Care – PPO | Source: Ambulatory Visit | Attending: Pulmonary Disease | Admitting: Pulmonary Disease

## 2023-03-16 DIAGNOSIS — D869 Sarcoidosis, unspecified: Secondary | ICD-10-CM | POA: Insufficient documentation

## 2023-03-23 ENCOUNTER — Other Ambulatory Visit (HOSPITAL_COMMUNITY): Payer: Self-pay

## 2023-03-26 ENCOUNTER — Ambulatory Visit (HOSPITAL_BASED_OUTPATIENT_CLINIC_OR_DEPARTMENT_OTHER): Payer: BC Managed Care – PPO | Admitting: Pulmonary Disease

## 2023-03-26 ENCOUNTER — Encounter (HOSPITAL_BASED_OUTPATIENT_CLINIC_OR_DEPARTMENT_OTHER): Payer: Self-pay | Admitting: Pulmonary Disease

## 2023-03-26 VITALS — BP 108/82 | HR 96 | Ht 68.0 in | Wt 241.3 lb

## 2023-03-26 DIAGNOSIS — D869 Sarcoidosis, unspecified: Secondary | ICD-10-CM | POA: Diagnosis not present

## 2023-03-26 MED ORDER — ARNUITY ELLIPTA 200 MCG/ACT IN AEPB: 1.0000 | INHALATION_SPRAY | Freq: Every day | RESPIRATORY_TRACT | 11 refills | Status: DC

## 2023-03-26 MED ORDER — PREDNISONE 5 MG PO TABS: 5.0000 mg | ORAL_TABLET | ORAL | 5 refills | Status: AC

## 2023-03-26 NOTE — Progress Notes (Signed)
 Subjective:   PATIENT ID: Faith Willis GENDER: female DOB: [birth year], MRN: [id number]   HPI  Chief Complaint  Patient presents with   Follow-up    CT FU   Reason for Visit: Follow-up  [name] is a 48 year old female never smoker with sarcoid, allergic rhinitis and reflux who presents for follow-up.  Synopsis: She was previously followed by Dr. Geronimo at [facility]. Prior to this she was seen at [facility] in [place] from 2020-2021 for calcified pulmonary nodules and mediastinal adenopathy. She was asymptomatic and had normal PFTs. Underwent EBUS 02/08/2019 that was nondiagnostic. In 02/27/2019, she was diagnosed with COVID. Had worsening symptoms and CTA 03/19/19 demonstrated progression of mediastinal and hilar adenopathy and bronchus intermedius obliteration. 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 She was started on steroid taper in 2021. Was on low-dose prednisone  in April and off bactrim . Never been on biologics. She moved to [facility] in summer 2021 and established care with [facility] in March 2022.  04/15/21 Since her last visit with Dr. Geronimo, she was started on Arunity. Improvement on Arnuity by 50%. Still have episodes of cough but not as severe. Cough occurs with rest and not with activity. No shortness of breath or wheezing. Denies chest pain. No LE swelling.    05/31/21 Since her last visit, she was started on Symbicort  for cough however this was discontinued due to reported elevated blood pressure readings.  After stopping Symbicort  for 4 days she called our office to report continued elevated blood pressure readings.  She also had complaints of chest heaviness that prompted her to visit the ED on 05/25/2021.  ED note reviewed and demonstrated negative work-up with troponin less than 3 and normal CBC and c-Met.  Chest x-ray with no acute abnormalities, noted nodular bilateral perihilar region.  EKG  with heart rate 83 and no signs for ischemia per report. Since stopping Symbicort  her cough which was previously resolved has now returned. She continues to have hypertension with SBP in the 150s despite stopping the Symbicort  >10 days ago. She reports family hx significant for hypertension.  12/13/21 She is currently on Arnuity which improved her coughing and wheezing and chest heaviness. Did not tolerate Symbicort  and Spiriva . Previously used to do boot camp. Currently in spin class and weight classes. Only needs albuterol  with heavy exercise. She is trying to walk more. She has unchanged night sweats that soak her.  03/07/22 Compliant with her prednisone  and Arnuity. Cough has completely resolved. No concerns or compliants at this time. Completed PFTs and wanting to discuss CT scan  06/19/22 Since our last visit she reports she has done well. Compliant with Arnuity and prednisone  20 mg daily. Cough has completely resolved. No shortness of breath or wheezing. Prednisone  does seem to make her active but otherwise tolerable. Has fatigue but wakes up early every day. She exercises daily including weights and cardio 30 min six days a week  12/17/22 Since our last visit she reports from a respiratory standpoint she has had some shortness of breath with exercise. Exercises 5x a week with cardio and weight and hour daily. Does walking videos and treadmill for cardio portion.  03/25/22 Since our last visit she is overall doing well with resolved cough. Not on Arnuity for a week. No exacerbations since our last visit. Compliant with low dose prednisone .  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 [place]  Past Medical History:  Diagnosis Date   Acid reflux    Allergic rhinitis    Arthritis    Sarcoid     Family History  Problem Relation Age of Onset   Diabetes Other      Social History   Occupational  History   Not on file  Tobacco Use   Smoking status: Never    Passive exposure: Past   Smokeless tobacco: Never  Substance and Sexual Activity   Alcohol use: Not on file   Drug use: Not on file   Sexual activity: Not on file    Allergies  Allergen Reactions   Cefuroxime Diarrhea and Nausea And Vomiting    diarrhea diarrhea diarrhea diarrhea    Hydrocodone-Acetaminophen     Other reaction(s): Dizziness Passed out Passed out Passed out Passed out      Outpatient Medications Prior to Visit  Medication Sig Dispense Refill   albuterol  (VENTOLIN  HFA) 108 (90 Base) MCG/ACT inhaler Inhale 2 puffs into the lungs every 6 (six) hours as needed for wheezing or shortness of breath. 18 g 1   ascorbic acid (VITAMIN C) 100 MG tablet Take 500 mg by mouth daily.     calcium-vitamin D 250-100 MG-UNIT tablet Take 1 tablet by mouth 2 (two) times daily.     cetirizine (ZYRTEC) 10 MG tablet Take by mouth.     Fluticasone Furoate  (ARNUITY ELLIPTA ) 200 MCG/ACT AEPB Inhale 1 puff into the lungs daily. 30 each 5   hydrochlorothiazide (HYDRODIURIL) 12.5 MG tablet Take 12.5 mg by mouth daily.     losartan (COZAAR) 100 MG tablet Take 100 mg by mouth daily.     Melatonin 5 MG CAPS Take 10 mg by mouth.     Multiple Vitamin (MULTIVITAMIN) capsule Take 1 capsule by mouth daily.     omeprazole (PRILOSEC) 20 MG capsule Take 1 capsule (20 mg total) by mouth daily.     predniSONE  (DELTASONE ) 5 MG tablet Take 1 tablet (5 mg total) by mouth daily with breakfast. 30 tablet 5   Azelastine  HCl 0.15 % SOLN Place 1 spray into both nostrils 2 (two) times daily as needed. (Patient not taking: Reported on 03/26/2023) 30 mL 5   sulfamethoxazole -trimethoprim  (BACTRIM ) 200-40 MG/5ML suspension Do NOT TAKE-Bring to Allergy  clinic for challenge. 100 mL 0   No facility-administered medications prior to visit.    Review of Systems  Constitutional:  Negative for chills, diaphoresis, fever, malaise/fatigue and weight loss.   HENT:  Negative for congestion.   Respiratory:  Negative for cough, hemoptysis, sputum production, shortness of breath and wheezing.   Cardiovascular:  Negative for chest pain, palpitations and leg swelling.     Objective:   Vitals:   03/26/23 1622  BP: 108/82  Pulse: 96  SpO2: 98%  Weight: 241 lb 4.8 oz (109.5 kg)  Height: 5' 8 (1.727 m)    SpO2: 98 %  Physical Exam: General: Well-appearing, no acute distress HENT: [place], AT Eyes: EOMI, no scleral icterus Respiratory: Clear to auscultation bilaterally.  No crackles, wheezing or rales Cardiovascular: RRR, -M/R/G, no JVD Extremities:-Edema,-tenderness Neuro: AAO x4, CNII-XII grossly intact Psych: Normal mood, normal affect  Data Reviewed:  Imaging: CT Chest 07/17/20 Mediastinal and hilar parenchymal findings of sarcoid. RML bronchus narrowing. Peripheral right lower lobe nodule measuring 8mm CT chest 05/14/2021-mildly enlarged bilateral hilar and mediastinal nodes.  Right lower lobe nodule 10mm and 8 mm, previously 9 and 8 respectively.  Perihilar nodule in the right middle  lobe enlarged from 9 to 1.3 cm. CT Chest 12/06/21 - Innumerable pulmonary nodules, increased in right upper and lower lung. Unchanged size in nodules with largest 13 mm. Enlarged mediastinal and hilar lymph nodes CT Chest 03/07/22 - Improved pulmonary nodules in size and density CT Chest 06/06/22 - Improved bilateral peribronchovascular nodularity. Decreased density in previously seen masses CT Chest 12/05/22 - Stable calcified and noncalcified adenopathy in mediastinum and hilar region. Stable peribronchovascular nodularity with some areas including RML, stable 9 x6 mm RLL pulmonary nodule CT Chest 03/16/23 - Stable sarcoid findings  PFT: 07/17/20 FVC 3.45 (82%) FEV1 2.8 (83%) Ratio 76  TLC 92% DLCO 112% Interpretation: Normal PFTs. No significant BD response  04/15/20 FVC 3.35 (80%) FEV1 2.65 (79%) Ratio 74  TLC 95% DLCO 96% Interpretation: Normal PFTs. No  significant change in FVC and FEV1. No significant BD response.  03/07/22 FVC 3.35 (80%) FEV1 2.69 (81%) Ratio 76  TLC 92% DLCO 101% Interpretation: Normal PFTs. Stable   Labs: CBC    Component Value Date/Time   WBC 6.2 05/09/2021 1638   RBC 4.90 05/09/2021 1638   HGB 14.3 05/09/2021 1638   HCT 44.4 05/09/2021 1638   PLT 322 05/09/2021 1638   MCV 91 05/09/2021 1638   MCH 29.2 05/09/2021 1638   MCHC 32.2 05/09/2021 1638   RDW 13.2 05/09/2021 1638   LYMPHSABS 1.4 05/09/2021 1638   EOSABS 0.2 05/09/2021 1638   BASOSABS 0.0 05/09/2021 1638   BMET    Component Value Date/Time   NA 140 05/09/2021 1638   K 4.4 05/09/2021 1638   CL 102 05/09/2021 1638   CO2 24 05/09/2021 1638   GLUCOSE 121 (H) 05/09/2021 1638   BUN 16 05/09/2021 1638   CREATININE 0.81 05/09/2021 1638   CALCIUM 8.9 05/09/2021 1638   EGFR 92 05/09/2021 1638   EKG/Echo: EKG 04/05/19 - Incomplete RBBB per report Echo - 05/14/2021 - EF 60 to 65%.  No WMA.  Normal diastolic.  Normal RV size and function.  No valvular abnormalities EKG 05/24/2021 - normal sinus rhythm.  Heart rate 83 PR interval 166 QTc 455  Pathology: [facility] LAB AP CASE REPORT:Surgical Pathology                                Case: [id number]                                  Authorizing Provider:  Lonni IVAR Noun, MD   Collected:           03/21/2019 1138              Ordering Location:     [facility] -     Received:            03/21/2019 1159                                     Operating Room                                                              Pathologist:  Maude DOROTHA Du, MD                                                        Intraop:               Maude DOROTHA Du, MD                                                        Specimens:   A) - Lung, left lower lobe biopsy                                                                   B) - Lung, bronchus intermedius                                                                      C) - Lung, left upper lobe bipsy                                                                   D) - Trachea, tracheal biopsy                                                                       E) - Mediastinum , proximal 4R                                                                     F) - Lung, bronchus intermediate                                                      [facility] LAB AP PRE-OPERATIVE DIAGNOSIS:Lymphadenopathy  [facility] LAB AP PATHOLOGY PROCEDURES:Bronchoscopy, cervical mediastinoscopy, EBUS, EGD  Bronchoscopy w/EBUS  EGD  [facility] LAB AP FINAL DIAGNOSIS:  A. LEFT LOWER LOBE BIOPSY:   BRONCHIAL MUCOSA SHOWING  GRANULATION TISSUE AND CHRONIC ACTIVE  INFLAMMATION.   B. BRONCHUS INTERMEDIUS, BIOPSY:   ULCERATED BRONCHIAL MUCOSA SHOWING GRANULATION TISSUE AND CHRONIC ACTIVE  INFLAMMATION.   NO ACID FAST OR FUNGAL ORGANISMS SEEN ON AFB, GMS, PAS AND MUCICARMINE  STAINS.     C.  LEFT UPPER LOBE BIOPSY:   BRONCHIAL MUCOSA SHOWING GRANULATION TISSUE AND CHRONIC ACTIVE  INFLAMMATION.   D.  TRACHEA, BIOPSY:   ULCERATED BRONCHIAL MUCOSA SHOWING GRANULATION TISSUE AND CHRONIC ACTIVE  INFLAMMATION.   E.  PROXIMAL 4R, EBUS-GUIDED BIOPSY:   LYMPH NODE TISSUE SHOWING CONFLUENT WELL FORMED EPITHELIOID GRANULOMAS,  FOCALLY NECROTIZING.   NO ACID FAST OR FUNGAL ORGANISMS SEEN ON AFB AND GMS STAINS.   NEGATIVE FOR TUMOR.   NOTE: THE APPEARANCE IS CONSISTENT WITH SARCOIDOSIS, HOWEVER, AN  INFECTIOUS PROCESS IS ALSO IN THE DIFFERENTIAL DIAGNOSIS.   F.  BRONCHUS INTERMEDIUS, BIOPSY:   ULCERATED BRONCHIAL MUCOSA SHOWING GRANULATION TISSUE AND CHRONIC ACTIVE  INFLAMMATION.   Note: The airway biopsies show no evidence of malignancy and no viral  cytopathic effect is identified.     The case was referred to [facility] for  consultation.  Their report follows:   PATHOLOGIC DIAGNOSIS:  Consult slides from [facility], [place], [place]  [phone];  03/21/2019):   A.  Lung, left lower lobe, biopsy:    Bronchial mucosa with granulation tissue formation and admixed mixed  acute and chronic inflammation.   B. Lung, bronchus intermedius, frozen section biopsy:   Ulcerated bronchial mucosa with granulation tissue formation, admixed  mixed acute and chronic inflammation, fibrinopurulent exudate, and focus  of histiocytic aggregates.   Received stains for AFB, GMS, PAS, and mucicarmine are negative.   C.  Lung, left upper lobe, biopsy:   Inflamed bronchial mucosa with granulation tissue formation and admixed  mixed acute and chronic inflammation.   D.  Trachea, biopsy:   Ulcerated bronchial mucosa with granulation tissue formation, admixed  mixed acute and chronic inflammation, fibrinopurulent exudate and focus of  histiocytic aggregates.   E.  Mediastinum, lymph node, proximal 4R, biopsy:   Lymph node with confluent well-formed, focally necrotizing granulomas with  hyalinization, see note.   Note:  Received stains for GMS and AFB are negative for micro-organisms.    The differential diagnosis includes sarcoidosis and an infectious process,  clinical correlation recommended.   F.  Lung, bronchus intermediate, biopsy:   Ulcerated bronchial mucosa with granulation tissue formation, admixed  mixed acute and chronic inflammation, and fibrinopurulent exudate.   Electronically signed out:  Katie Brochure, M.D.   Date:  04/01/2019  [facility] Department of Pathology     Assessment & Plan:   Discussion: 48 year old female never smoker with sarcoid, allergic rhinitis and reflux who presents for follow-up for sarcoid flare. On low dose prednisone  with improved symptoms and radiographic findings since 11/2021. Reviewed CT with overall improved and stable sarcoid findings. Plan to taper steroids and repeat CT. f worsening sarcoid during taper than will need to start methotrexate.    Adverse effects of steroids discussed including bruising, weight gain, fluid retention, Cushingoid features, hypertension, cardiac arrhythmias, osteoporosis, gastric ulcers, increased risk of infection, insomnia and hyperglycemia.  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 Last 02/2022 and stable  - Recommend annual ophthalmology exam.  Last visit on 2021.   Note: 02/18/22 - [facility] [phone] has attempted to reach patient 3  times on 11/2, 11/9, 11/16 and unable to schedule appointment. - Reviewed echo 04/2021 and EKG: Normal  Chronic cough - resolved --CONTINUE Arnuity ONE puff daily --Failed Symbicort  and Spiriva : Possible ADE with hypertension --CONTINUE Albuterol  as needed for shortness of breath, wheezing and cough  High risk medication monitoring --Reviewed CT Chest with improving sarcoid - Previously completed taper in 2021 - Restart taper 11/2021 at 20 mg > 06/19/22 10 mg - Taper 12/17/22 - 03/26/23 prednisone  5 mg --Decrease prednisone  5 mg every other day for two weeks. Then every 3 days for two weeks. Then STOP  >If symptoms worsened on follow-up imaging then will need to consider methotrexate --ORDER CT Chest without contrast in 3 months (end of March) --Continue omeprazole 20 mg daily for ulcer prevention with steroids. OK to stop  Osteoporosis prevention in setting of chronic steroid use If you are taking >2.5 mg of prednisone  for >3 month regardless of risk fracture or have moderate-high risk fracture: --Recommend lifestyple modifications (e.g. balanced diet, smoking cessation, moderate alcohol intake and regular weight bearing or resistance exercises) --Recommend calcium 1000 mg and and vitamin D 800U    Health Maintenance Immunization History  Administered Date(s) Administered   Influenza, Seasonal, Injecte, Preservative Fre 12/31/2016, 12/21/2020   Influenza,inj,Quad PF,6+ Mos 12/31/2016,  12/21/2020, 12/13/2021   Influenza-Unspecified 12/31/2016, 11/23/2019, 12/21/2020, 12/13/2022   Moderna Sars-Covid-2 Vaccination 04/22/2019, 04/22/2019, 05/20/2019, 11/23/2019, 11/23/2019, 12/14/2019   Pfizer Covid-19 Vaccine Bivalent Booster 50yrs & up 06/23/2021   Pfizer(Comirnaty)Fall Seasonal Vaccine 12 years and older 12/15/2021   Tdap 07/08/2016   CT Lung Screen- not qualified. Never smoker  Orders Placed This Encounter  Procedures   CT Chest Wo Contrast    Standing Status:   Future    Expiration Date:   03/25/2024    Scheduling Instructions:     Schedule in 3 months (end of March)    Is patient pregnant?:   No    Preferred imaging location?:   [facility]   Meds ordered this encounter  Medications   predniSONE  (DELTASONE ) 5 MG tablet    Sig: Take 1 tablet (5 mg total) by mouth every other day.    Dispense:  30 tablet    Refill:  5   Fluticasone Furoate  (ARNUITY ELLIPTA ) 200 MCG/ACT AEPB    Sig: Inhale 1 puff into the lungs daily.    Dispense:  30 each    Refill:  11   Return in 3 months (on 06/24/2023) for after CT scan. Schedule in early April.  I have spent a total time of 32-minutes on the day of the appointment including chart review, data review, collecting history, coordinating care and discussing medical diagnosis and plan with the patient/family. Past medical history, allergies, medications were reviewed. Pertinent imaging, labs and tests included in this note have been reviewed and interpreted independently by me.  Gaspar Fowle Slater Staff, MD [facility] 03/26/2023 4:30 PM

## 2023-03-26 NOTE — Patient Instructions (Addendum)
 Pulmonary sarcoidosis --Decrease prednisone  5 mg every other day for two weeks. Then every 3 days for two weeks. Then STOP  >If symptoms worsened on follow-up imaging then will need to consider methotrexate --ORDER CT Chest without contrast in 3 months (end of March) --Continue omeprazole 20 mg daily for ulcer prevention with steroids. OK to stop  Chronic cough - resolved --CONTINUE Arnuity ONE puff daily

## 2023-04-28 ENCOUNTER — Encounter (HOSPITAL_BASED_OUTPATIENT_CLINIC_OR_DEPARTMENT_OTHER): Payer: Self-pay | Admitting: Pulmonary Disease

## 2023-04-28 NOTE — Telephone Encounter (Signed)
Pt reports she is taking Arnuity and Albuterol as directed but she is still reporting that since off Prednisone now and shortness of breath and cough is back .Please advise

## 2023-05-11 ENCOUNTER — Telehealth (HOSPITAL_BASED_OUTPATIENT_CLINIC_OR_DEPARTMENT_OTHER): Payer: Self-pay | Admitting: Pulmonary Disease

## 2023-05-11 ENCOUNTER — Other Ambulatory Visit (HOSPITAL_COMMUNITY): Payer: Self-pay

## 2023-05-11 ENCOUNTER — Ambulatory Visit (HOSPITAL_BASED_OUTPATIENT_CLINIC_OR_DEPARTMENT_OTHER): Payer: BC Managed Care – PPO | Admitting: Pulmonary Disease

## 2023-05-11 ENCOUNTER — Encounter (HOSPITAL_BASED_OUTPATIENT_CLINIC_OR_DEPARTMENT_OTHER): Payer: Self-pay | Admitting: Pulmonary Disease

## 2023-05-11 ENCOUNTER — Telehealth (HOSPITAL_COMMUNITY): Payer: Self-pay

## 2023-05-11 VITALS — BP 110/82 | HR 101 | Ht 68.0 in | Wt 237.2 lb

## 2023-05-11 DIAGNOSIS — R051 Acute cough: Secondary | ICD-10-CM | POA: Diagnosis not present

## 2023-05-11 MED ORDER — MOMETASONE FURO-FORMOTEROL FUM 200-5 MCG/ACT IN AERO: 2.0000 | INHALATION_SPRAY | Freq: Two times a day (BID) | RESPIRATORY_TRACT | 5 refills | Status: DC

## 2023-05-11 NOTE — Telephone Encounter (Signed)
 Please send PA for Dulera 200-5 TWO puffs in the morning and evening.   Patient has failed Symbicort due to hypertension. Documented in pulmonary notes  If Elwin Sleight is denied, please submit PA for Advair diskus 250-50 mcg ONE puff TWICE a day

## 2023-05-11 NOTE — Progress Notes (Signed)
 Subjective:   PATIENT ID: Faith Willis GENDER: female DOB: [birth year], MRN: [id number]   HPI  Chief Complaint  Patient presents with   Follow-up    Sarcoidosis   Reason for Visit: Follow-up  [name] is a 48 year old female never smoker with sarcoid, allergic rhinitis and reflux who presents for follow-up.  Synopsis: She was previously followed by Dr. Marchelle Gearing at [facility]. Prior to this she was seen at [facility] in [place] from 2020-2021 for calcified pulmonary nodules and mediastinal adenopathy. She was asymptomatic and had normal PFTs. Underwent EBUS 02/08/2019 that was nondiagnostic. In 02/27/2019, she was diagnosed with COVID. Had worsening symptoms and CTA 03/19/19 demonstrated progression of mediastinal and hilar adenopathy and bronchus intermedius obliteration. 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 She was started on steroid taper in 2021. Was on low-dose prednisone in April and off bactrim. Never been on biologics. She moved to [facility] in summer 2021 and established care with [facility] in March 2022.  04/15/21 Since her last visit with Dr. Marchelle Gearing, she was started on Arunity. Improvement on Arnuity by 50%. Still have episodes of cough but not as severe. Cough occurs with rest and not with activity. No shortness of breath or wheezing. Denies chest pain. No LE swelling.    05/31/21 Since her last visit, she was started on Symbicort for cough however this was discontinued due to reported elevated blood pressure readings.  After stopping Symbicort for 4 days she called our office to report continued elevated blood pressure readings.  She also had complaints of chest heaviness that prompted her to visit the ED on 05/25/2021.  ED note reviewed and demonstrated negative work-up with troponin less than 3 and normal CBC and c-Met.  Chest x-ray with no acute abnormalities, noted nodular bilateral perihilar region.   EKG with heart rate 83 and no signs for ischemia per report. Since stopping Symbicort her cough which was previously resolved has now returned. She continues to have hypertension with SBP in the 150s despite stopping the Symbicort >10 days ago. She reports family hx significant for hypertension.  12/13/21 She is currently on Arnuity which improved her coughing and wheezing and chest heaviness. Did not tolerate Symbicort and Spiriva. Previously used to do boot camp. Currently in spin class and weight classes. Only needs albuterol with heavy exercise. She is trying to walk more. She has unchanged night sweats that soak her.  03/07/22 Compliant with her prednisone and Arnuity. Cough has completely resolved. No concerns or compliants at this time. Completed PFTs and wanting to discuss CT scan  06/19/22 Since our last visit she reports she has done well. Compliant with Arnuity and prednisone 20 mg daily. Cough has completely resolved. No shortness of breath or wheezing. Prednisone does seem to make her active but otherwise tolerable. Has fatigue but wakes up early every day. She exercises daily including weights and cardio 30 min six days a week  12/17/22 Since our last visit she reports from a respiratory standpoint she has had some shortness of breath with exercise. Exercises 5x a week with cardio and weight and hour daily. Does walking videos and treadmill for cardio portion.  03/25/22 Since our last visit she is overall doing well with resolved cough. Not on Arnuity for a week. No exacerbations since our last visit. Compliant with low dose prednisone.  05/11/23 Since our last visit she has been weaned off steroids 2-3 weeks ago. Developed cough nonproductive that has 50% improved after  amoxicillin one week ago. Had some shortness of breath. Denies wheezing.  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 [place]  Past Medical History:  Diagnosis Date   Acid reflux    Allergic rhinitis    Arthritis    Sarcoid     Family History  Problem Relation Age of Onset   Diabetes Other      Social History   Occupational History   Not on file  Tobacco Use   Smoking status: Never    Passive exposure: Past   Smokeless tobacco: Never  Substance and Sexual Activity   Alcohol use: Not on file   Drug use: Not on file   Sexual activity: Not on file    Allergies  Allergen Reactions   Cefuroxime Diarrhea and Nausea And Vomiting    diarrhea diarrhea diarrhea diarrhea    Hydrocodone-Acetaminophen     Other reaction(s): Dizziness Passed out Passed out Passed out Passed out      Outpatient Medications Prior to Visit  Medication Sig Dispense Refill   albuterol (VENTOLIN HFA) 108 (90 Base) MCG/ACT inhaler Inhale 2 puffs into the lungs every 6 (six) hours as needed for wheezing or shortness of breath. 18 g 1   ascorbic acid (VITAMIN C) 100 MG tablet Take 500 mg by mouth daily.     calcium-vitamin D 250-100 MG-UNIT tablet Take 1 tablet by mouth 2 (two) times daily.     cetirizine (ZYRTEC) 10 MG tablet Take by mouth.     hydrochlorothiazide (HYDRODIURIL) 12.5 MG tablet Take 12.5 mg by mouth daily.     losartan (COZAAR) 100 MG tablet Take 100 mg by mouth daily.     Melatonin 5 MG CAPS Take 10 mg by mouth.     Multiple Vitamin (MULTIVITAMIN) capsule Take 1 capsule by mouth daily.     omeprazole (PRILOSEC) 20 MG capsule Take 1 capsule (20 mg total) by mouth daily.     Fluticasone Furoate (ARNUITY ELLIPTA) 200 MCG/ACT AEPB Inhale 1 puff into the lungs daily. 30 each 11   predniSONE (DELTASONE) 5 MG tablet Take 1 tablet (5 mg total) by mouth every other day. (Patient not taking: Reported on 05/11/2023) 30 tablet 5   No facility-administered medications prior to visit.    Review of Systems  Constitutional:  Negative for chills, diaphoresis, fever, malaise/fatigue and weight loss.  HENT:   Negative for congestion.   Respiratory:  Positive for cough. Negative for hemoptysis, sputum production, shortness of breath and wheezing.   Cardiovascular:  Negative for chest pain, palpitations and leg swelling.     Objective:   Vitals:   05/11/23 0817  BP: 110/82  Pulse: (!) 101  SpO2: 97%  Weight: 237 lb 3.2 oz (107.6 kg)  Height: 5\' 8"  (1.727 m)   SpO2: 97 %  Physical Exam: General: Well-appearing, no acute distress HENT: [place], AT Eyes: EOMI, no scleral icterus Respiratory: Clear to auscultation bilaterally.  No crackles, wheezing or rales Cardiovascular: RRR, -M/R/G, no JVD Extremities:-Edema,-tenderness Neuro: AAO x4, CNII-XII grossly intact Psych: Normal mood, normal affect  Data Reviewed:  Imaging: CT Chest 07/17/20 Mediastinal and hilar parenchymal findings of sarcoid. RML bronchus narrowing. Peripheral right lower lobe nodule measuring 8mm CT chest 05/14/2021-mildly enlarged bilateral hilar and mediastinal nodes.  Right lower lobe nodule 10mm and 8 mm, previously 9 and 8 respectively.  Perihilar nodule in the right middle lobe enlarged from 9 to  1.3 cm. CT Chest 12/06/21 - Innumerable pulmonary nodules, increased in right upper and lower lung. Unchanged size in nodules with largest 13 mm. Enlarged mediastinal and hilar lymph nodes CT Chest 03/07/22 - Improved pulmonary nodules in size and density CT Chest 06/06/22 - Improved bilateral peribronchovascular nodularity. Decreased density in previously seen masses CT Chest 12/05/22 - Stable calcified and noncalcified adenopathy in mediastinum and hilar region. Stable peribronchovascular nodularity with some areas including RML, stable 9 x6 mm RLL pulmonary nodule CT Chest 03/16/23 - Stable sarcoid findings  PFT: 07/17/20 FVC 3.45 (82%) FEV1 2.8 (83%) Ratio 76  TLC 92% DLCO 112% Interpretation: Normal PFTs. No significant BD response  04/15/20 FVC 3.35 (80%) FEV1 2.65 (79%) Ratio 74  TLC 95% DLCO 96% Interpretation: Normal  PFTs. No significant change in FVC and FEV1. No significant BD response.  03/07/22 FVC 3.35 (80%) FEV1 2.69 (81%) Ratio 76  TLC 92% DLCO 101% Interpretation: Normal PFTs. Stable   Labs: CBC    Component Value Date/Time   WBC 6.2 05/09/2021 1638   RBC 4.90 05/09/2021 1638   HGB 14.3 05/09/2021 1638   HCT 44.4 05/09/2021 1638   PLT 322 05/09/2021 1638   MCV 91 05/09/2021 1638   MCH 29.2 05/09/2021 1638   MCHC 32.2 05/09/2021 1638   RDW 13.2 05/09/2021 1638   LYMPHSABS 1.4 05/09/2021 1638   EOSABS 0.2 05/09/2021 1638   BASOSABS 0.0 05/09/2021 1638   BMET    Component Value Date/Time   NA 140 05/09/2021 1638   K 4.4 05/09/2021 1638   CL 102 05/09/2021 1638   CO2 24 05/09/2021 1638   GLUCOSE 121 (H) 05/09/2021 1638   BUN 16 05/09/2021 1638   CREATININE 0.81 05/09/2021 1638   CALCIUM 8.9 05/09/2021 1638   EGFR 92 05/09/2021 1638   EKG/Echo: EKG 04/05/19 - Incomplete RBBB per report Echo - 05/14/2021 - EF 60 to 65%.  No WMA.  Normal diastolic.  Normal RV size and function.  No valvular abnormalities EKG 05/24/2021 - normal sinus rhythm.  Heart rate 83 PR interval 166 QTc 455  Pathology: [facility] LAB AP CASE REPORT:Surgical Pathology                                Case: [id number]                                  Authorizing Provider:  Toma Deiters, MD   Collected:           03/21/2019 1138              Ordering Location:     [facility] -     Received:            03/21/2019 1159                                     Operating Room                                                              Pathologist:  Colin Broach, MD                                                        Intraop:               Colin Broach, MD                                                        Specimens:   A) - Lung, left lower lobe biopsy                                                                   B) - Lung, bronchus intermedius                                                                      C) - Lung, left upper lobe bipsy                                                                   D) - Trachea, tracheal biopsy                                                                       E) - Mediastinum , proximal 4R                                                                     F) - Lung, bronchus intermediate                                                      [facility] LAB AP PRE-OPERATIVE DIAGNOSIS:Lymphadenopathy  [facility] LAB AP PATHOLOGY PROCEDURES:Bronchoscopy, cervical mediastinoscopy, EBUS, EGD  Bronchoscopy w/EBUS  EGD  [facility] LAB AP FINAL DIAGNOSIS:  A. LEFT LOWER LOBE BIOPSY:   BRONCHIAL MUCOSA SHOWING  GRANULATION TISSUE AND CHRONIC ACTIVE  INFLAMMATION.   B. BRONCHUS INTERMEDIUS, BIOPSY:   ULCERATED BRONCHIAL MUCOSA SHOWING GRANULATION TISSUE AND CHRONIC ACTIVE  INFLAMMATION.   NO ACID FAST OR FUNGAL ORGANISMS SEEN ON AFB, GMS, PAS AND MUCICARMINE  STAINS.     C.  LEFT UPPER LOBE BIOPSY:   BRONCHIAL MUCOSA SHOWING GRANULATION TISSUE AND CHRONIC ACTIVE  INFLAMMATION.   D.  TRACHEA, BIOPSY:   ULCERATED BRONCHIAL MUCOSA SHOWING GRANULATION TISSUE AND CHRONIC ACTIVE  INFLAMMATION.   E.  PROXIMAL 4R, EBUS-GUIDED BIOPSY:   LYMPH NODE TISSUE SHOWING CONFLUENT WELL FORMED EPITHELIOID GRANULOMAS,  FOCALLY NECROTIZING.   NO ACID FAST OR FUNGAL ORGANISMS SEEN ON AFB AND GMS STAINS.   NEGATIVE FOR TUMOR.   NOTE: THE APPEARANCE IS CONSISTENT WITH SARCOIDOSIS, HOWEVER, AN  INFECTIOUS PROCESS IS ALSO IN THE DIFFERENTIAL DIAGNOSIS.   F.  BRONCHUS INTERMEDIUS, BIOPSY:   ULCERATED BRONCHIAL MUCOSA SHOWING GRANULATION TISSUE AND CHRONIC ACTIVE  INFLAMMATION.   Note: The airway biopsies show no evidence of malignancy and no viral  cytopathic effect is identified.     The case was referred to [facility] for  consultation.  Their report follows:   PATHOLOGIC DIAGNOSIS:  Consult slides from [facility],  [place], [place]  [phone];  03/21/2019):   A.  Lung, left lower lobe, biopsy:    Bronchial mucosa with granulation tissue formation and admixed mixed  acute and chronic inflammation.   B. Lung, bronchus intermedius, frozen section biopsy:   Ulcerated bronchial mucosa with granulation tissue formation, admixed  mixed acute and chronic inflammation, fibrinopurulent exudate, and focus  of histiocytic aggregates.   Received stains for AFB, GMS, PAS, and mucicarmine are negative.   C.  Lung, left upper lobe, biopsy:   Inflamed bronchial mucosa with granulation tissue formation and admixed  mixed acute and chronic inflammation.   D.  Trachea, biopsy:   Ulcerated bronchial mucosa with granulation tissue formation, admixed  mixed acute and chronic inflammation, fibrinopurulent exudate and focus of  histiocytic aggregates.   E.  Mediastinum, lymph node, proximal 4R, biopsy:   Lymph node with confluent well-formed, focally necrotizing granulomas with  hyalinization, see note.   Note:  Received stains for GMS and AFB are negative for micro-organisms.    The differential diagnosis includes sarcoidosis and an infectious process,  clinical correlation recommended.   F.  Lung, bronchus intermediate, biopsy:   Ulcerated bronchial mucosa with granulation tissue formation, admixed  mixed acute and chronic inflammation, and fibrinopurulent exudate.   Electronically signed out:  Angelina Pih, M.D.   Date:  04/01/2019  [facility] Department of Pathology     Assessment & Plan:   Discussion: 48 year old female never smoker with sarcoid, allergic rhinitis and reflux who presents for follow-up for sarcoid flare. Previously on prolonged steroid taper with improved symptoms and radiographic findings since 11/2021. Recurrent cough off steroids. Will optimize with ICS/LABA and pending CT scan may need to consider methotrexate if sarcoid worsens based on  testing.  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 Last 02/2022 and stable  - Recommend annual ophthalmology exam.  Last visit on 2021.   Note: 02/18/22 - [facility] [phone] has attempted to reach patient 3 times on 11/2, 11/9, 11/16 and unable to schedule appointment. - Reviewed echo 04/2021 and EKG: Normal  Acute on chronic cough - possibly worsened by viral/bacterial URI but question recurrent  sarcoid flare  --Failed Symbicort and Spiriva: Possible ADE with hypertension --CONTINUE Arnuity ONE puff daily for now --Will request PA for Dulera 200-5 mcg TWO puffs in the morning and evening. Rinse out mouth after use.  If unable to obtain PA for this med, will pursue Advair diskus(generic, brand, and Wixela) --CONTINUE Albuterol as needed for shortness of breath, wheezing and cough --OK to consider short course of prednisone if symptoms severe in the future - similar to asthma tx  High risk medication monitoring --Reviewed CT Chest with improving sarcoid - Previously completed taper in 2021 - Restart taper 11/2021 at 20 mg > 06/19/22 10 mg - Taper 12/17/22 - 03/26/23 prednisone 5 mg - Tapered off end of 03/2023 --SCHEDULE CT Chest without contrast in 3 months (end of March) --Continue omeprazole 20 mg daily for ulcer prevention with steroids. OK to stop  Osteoporosis prevention in setting of chronic steroid use If you are taking >2.5 mg of prednisone for >3 month regardless of risk fracture or have moderate-high risk fracture: --Recommend lifestyple modifications (e.g. balanced diet, smoking cessation, moderate alcohol intake and regular weight bearing or resistance exercises) --Recommend calcium 1000 mg and and vitamin D 800U    Health Maintenance Immunization History  Administered Date(s) Administered   Influenza, Seasonal, Injecte, Preservative Fre 12/31/2016, 12/21/2020   Influenza,inj,Quad PF,6+ Mos 12/31/2016,  12/21/2020, 12/13/2021   Influenza-Unspecified 12/31/2016, 11/23/2019, 12/21/2020, 12/13/2022   Moderna Sars-Covid-2 Vaccination 04/22/2019, 04/22/2019, 05/20/2019, 11/23/2019, 11/23/2019, 12/14/2019   Pfizer Covid-19 Vaccine Bivalent Booster 11yrs & up 06/23/2021   Pfizer(Comirnaty)Fall Seasonal Vaccine 12 years and older 12/15/2021   Tdap 07/08/2016   CT Lung Screen- not qualified. Never smoker  No orders of the defined types were placed in this encounter.  Meds ordered this encounter  Medications   mometasone-formoterol (DULERA) 200-5 MCG/ACT AERO    Sig: Inhale 2 puffs into the lungs 2 (two) times daily.    Dispense:  1 each    Refill:  5   No follow-ups on file. Follow-up already scheduled  I have spent a total time of 31-minutes on the day of the appointment including chart review, data review, collecting history, coordinating care and discussing medical diagnosis and plan with the patient/family. Past medical history, allergies, medications were reviewed. Pertinent imaging, labs and tests included in this note have been reviewed and interpreted independently by me.  Greg Cratty Mechele Collin, MD [facility] 05/11/2023 8:59 AM

## 2023-05-11 NOTE — Patient Instructions (Addendum)
 Acute on chronic cough - possibly worsened by viral/bacterial URI but question recurrent sarcoid flare  --Failed Symbicort and Spiriva: Possible ADE with hypertension --CONTINUE Arnuity ONE puff daily for now --Will request PA for Dulera 200-5 mcg TWO puffs in the morning and evening. Rinse out mouth after use.  If unable to obtain PA for this med, will pursue Advair diskus(generic, brand, and Wixela) --CONTINUE Albuterol as needed for shortness of breath, wheezing and cough --OK to consider short course of prednisone if symptoms severe in the future - similar to asthma tx

## 2023-05-11 NOTE — Telephone Encounter (Signed)
 Pharmacy Patient Advocate Encounter  Insurance verification completed.    The patient is insured through [employer]/ [employer].     Ran test claim for Symbicort and the current 30 day co-pay is $30.  Ran test claim for [facility] and the current 30 day co-pay is PA Required.  Ran test claim for Advair Diskus and the current 30 day co-pay is PA Required.  Ran test claim for [employer] and the current 30 day co-pay is PA Required.    This test claim was processed through [facility]- copay amounts may vary at other pharmacies due to pharmacy/plan contracts, or as the patient moves through the different stages of their insurance plan.

## 2023-05-14 ENCOUNTER — Telehealth: Payer: Self-pay

## 2023-05-14 ENCOUNTER — Other Ambulatory Visit (HOSPITAL_COMMUNITY): Payer: Self-pay

## 2023-05-14 NOTE — Telephone Encounter (Signed)
 Pharmacy Patient Advocate Encounter   Received notification from CoverMyMeds that prior authorization for [facility] 200-5MCG/ACT aerosol is required/requested.   Insurance verification completed.   The patient is insured through [facility] .   Prior Authorization for [facility] 200-5MCG/ACT aerosol has been APPROVED from 05-14-2023 to 05-12-2024   PA #/Case ID/Reference #: Faith Willis

## 2023-05-14 NOTE — Telephone Encounter (Signed)
 noted

## 2023-05-14 NOTE — Telephone Encounter (Signed)
 PA approved. CMA contacted patient and left voicemail for patient to contact pharmacy.

## 2023-05-14 NOTE — Telephone Encounter (Signed)
 PA request has been Submitted. New Encounter created for follow up. For additional info see Pharmacy Prior Auth telephone encounter from 05-14-2023.

## 2023-05-14 NOTE — Telephone Encounter (Signed)
 Lm for patient to make her aware of approval.  Mychart message sent.

## 2023-06-18 ENCOUNTER — Ambulatory Visit (HOSPITAL_BASED_OUTPATIENT_CLINIC_OR_DEPARTMENT_OTHER): Payer: BC Managed Care – PPO | Attending: Pulmonary Disease

## 2023-06-23 ENCOUNTER — Ambulatory Visit (HOSPITAL_BASED_OUTPATIENT_CLINIC_OR_DEPARTMENT_OTHER)
Admission: RE | Admit: 2023-06-23 | Discharge: 2023-06-23 | Disposition: A | Discharge status: Home / Self Care | Source: Ambulatory Visit | Attending: Pulmonary Disease | Admitting: Pulmonary Disease

## 2023-06-23 DIAGNOSIS — D869 Sarcoidosis, unspecified: Secondary | ICD-10-CM | POA: Insufficient documentation

## 2023-07-01 ENCOUNTER — Ambulatory Visit (HOSPITAL_BASED_OUTPATIENT_CLINIC_OR_DEPARTMENT_OTHER): Payer: BC Managed Care – PPO | Admitting: Pulmonary Disease

## 2023-07-01 ENCOUNTER — Encounter (HOSPITAL_BASED_OUTPATIENT_CLINIC_OR_DEPARTMENT_OTHER): Payer: Self-pay | Admitting: Pulmonary Disease

## 2023-07-01 VITALS — BP 122/74 | HR 78 | Ht 66.0 in | Wt 231.4 lb

## 2023-07-01 DIAGNOSIS — R053 Chronic cough: Secondary | ICD-10-CM

## 2023-07-01 DIAGNOSIS — D869 Sarcoidosis, unspecified: Secondary | ICD-10-CM | POA: Diagnosis not present

## 2023-07-01 MED ORDER — MOMETASONE FURO-FORMOTEROL FUM 200-5 MCG/ACT IN AERO: 2.0000 | INHALATION_SPRAY | Freq: Two times a day (BID) | RESPIRATORY_TRACT | 11 refills | Status: AC

## 2023-07-01 NOTE — Progress Notes (Signed)
 Subjective:   PATIENT ID: Faith Willis: female DOB: March 28, 1975, MRN: [id number]   HPI  Chief Complaint  Patient presents with   Follow-up    Review CT- nocturnal cough and SOB with exercise    Reason for Visit: Follow-up  [name] is a 48 year old female never smoker with sarcoid, allergic rhinitis and reflux who presents for follow-up.  Synopsis: She was previously followed by Dr. Marchelle Willis at [facility]. Prior to this she was seen at [facility] in [place] from 2020-2021 for calcified pulmonary nodules and mediastinal adenopathy. She was asymptomatic and had normal PFTs. Underwent EBUS 02/08/2019 that was nondiagnostic. In 02/27/2019, she was diagnosed with COVID. Had worsening symptoms and CTA 03/19/19 demonstrated progression of mediastinal and hilar adenopathy and bronchus intermedius obliteration. 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 She was started on steroid taper in 2021. Was on low-dose prednisone in April and off bactrim. Never been on biologics. She moved to [facility] in summer 2021 and established care with [facility] in March 2022.  04/15/21 Since her last visit with Dr. Marchelle Willis, she was started on Arunity. Improvement on Arnuity by 50%. Still have episodes of cough but not as severe. Cough occurs with rest and not with activity. No shortness of breath or wheezing. Denies chest pain. No LE swelling.    05/31/21 Since her last visit, she was started on Symbicort for cough however this was discontinued due to reported elevated blood pressure readings.  After stopping Symbicort for 4 days she called our office to report continued elevated blood pressure readings.  She also had complaints of chest heaviness that prompted her to visit the ED on 05/25/2021.  ED note reviewed and demonstrated negative work-up with troponin less than 3 and normal CBC and c-Met.  Chest x-ray with no acute abnormalities, noted  nodular bilateral perihilar region.  EKG with heart rate 83 and no signs for ischemia per report. Since stopping Symbicort her cough which was previously resolved has now returned. She continues to have hypertension with SBP in the 150s despite stopping the Symbicort >10 days ago. She reports family hx significant for hypertension.  12/13/21 She is currently on Arnuity which improved her coughing and wheezing and chest heaviness. Did not tolerate Symbicort and Spiriva. Previously used to do boot camp. Currently in spin class and weight classes. Only needs albuterol with heavy exercise. She is trying to walk more. She has unchanged night sweats that soak her.  03/07/22 Compliant with her prednisone and Arnuity. Cough has completely resolved. No concerns or compliants at this time. Completed PFTs and wanting to discuss CT scan  06/19/22 Since our last visit she reports she has done well. Compliant with Arnuity and prednisone 20 mg daily. Cough has completely resolved. No shortness of breath or wheezing. Prednisone does seem to make her active but otherwise tolerable. Has fatigue but wakes up early every day. She exercises daily including weights and cardio 30 min six days a week  12/17/22 Since our last visit she reports from a respiratory standpoint she has had some shortness of breath with exercise. Exercises 5x a week with cardio and weight and hour daily. Does walking videos and treadmill for cardio portion.  03/25/22 Since our last visit she is overall doing well with resolved cough. Not on Arnuity for a week. No exacerbations since our last visit. Compliant with low dose prednisone.  05/11/23 Since our last visit she has been weaned off steroids 2-3 weeks ago.  Developed cough nonproductive that has 50% improved after amoxicillin one week ago. Had some shortness of breath. Denies wheezing.  07/01/23 Since our last visit she was started [facility]. She reports blood pressure has been normal with SBP <120.  Since being tapered off steroids she has had cough at night, however this is associated with forgetting her maintenance inhaler in evening. No limitation in activity.  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 [place]  Past Medical History:  Diagnosis Date   Acid reflux    Allergic rhinitis    Arthritis    Sarcoid     Family History  Problem Relation Age of Onset   Diabetes Other      Social History   Occupational History   Not on file  Tobacco Use   Smoking status: Never    Passive exposure: Past   Smokeless tobacco: Never  Substance and Sexual Activity   Alcohol use: Not on file   Drug use: Not on file   Sexual activity: Not on file    Allergies  Allergen Reactions   Cefuroxime Diarrhea and Nausea And Vomiting    diarrhea diarrhea diarrhea diarrhea    Hydrocodone-Acetaminophen     Other reaction(s): Dizziness Passed out Passed out Passed out Passed out      Outpatient Medications Prior to Visit  Medication Sig Dispense Refill   albuterol (VENTOLIN HFA) 108 (90 Base) MCG/ACT inhaler Inhale 2 puffs into the lungs every 6 (six) hours as needed for wheezing or shortness of breath. 18 g 1   ascorbic acid (VITAMIN C) 100 MG tablet Take 500 mg by mouth daily.     calcium-vitamin D 250-100 MG-UNIT tablet Take 1 tablet by mouth 2 (two) times daily.     cetirizine (ZYRTEC) 10 MG tablet Take by mouth.     hydrochlorothiazide (HYDRODIURIL) 12.5 MG tablet Take 12.5 mg by mouth daily.     losartan (COZAAR) 100 MG tablet Take 100 mg by mouth daily.     Melatonin 5 MG CAPS Take 10 mg by mouth.     Multiple Vitamin (MULTIVITAMIN) capsule Take 1 capsule by mouth daily.     omeprazole (PRILOSEC) 20 MG capsule Take 1 capsule (20 mg total) by mouth daily.     predniSONE (DELTASONE) 5 MG tablet Take 1 tablet (5 mg total) by mouth every other day. 30 tablet 5   mometasone-formoterol  (DULERA) 200-5 MCG/ACT AERO Inhale 2 puffs into the lungs 2 (two) times daily. 1 each 5   No facility-administered medications prior to visit.    Review of Systems  Constitutional:  Negative for chills, diaphoresis, fever, malaise/fatigue and weight loss.  HENT:  Negative for congestion.   Respiratory:  Positive for cough. Negative for hemoptysis, sputum production, shortness of breath and wheezing.   Cardiovascular:  Negative for chest pain, palpitations and leg swelling.     Objective:   Vitals:   07/01/23 0950  BP: 122/74  Pulse: 78  SpO2: 99%  Weight: 231 lb 6.4 oz (105 kg)  Height: 5\' 6"  (1.676 m)   SpO2: 99 %  Physical Exam: General: Well-appearing, no acute distress HENT: [place], AT Eyes: EOMI, no scleral icterus Respiratory: Clear to auscultation bilaterally.  No crackles, wheezing or rales Cardiovascular: RRR, -M/R/G, no JVD Extremities:-Edema,-tenderness Neuro: AAO x4, CNII-XII grossly intact Psych: Normal mood, normal affect   Data Reviewed:  Imaging: CT Chest 07/17/20 Mediastinal and hilar  parenchymal findings of sarcoid. RML bronchus narrowing. Peripheral right lower lobe nodule measuring 8mm CT chest 05/14/2021-mildly enlarged bilateral hilar and mediastinal nodes.  Right lower lobe nodule 10mm and 8 mm, previously 9 and 8 respectively.  Perihilar nodule in the right middle lobe enlarged from 9 to 1.3 cm. CT Chest 12/06/21 - Innumerable pulmonary nodules, increased in right upper and lower lung. Unchanged size in nodules with largest 13 mm. Enlarged mediastinal and hilar lymph nodes CT Chest 03/07/22 - Improved pulmonary nodules in size and density CT Chest 06/06/22 - Improved bilateral peribronchovascular nodularity. Decreased density in previously seen masses CT Chest 12/05/22 - Stable calcified and noncalcified adenopathy in mediastinum and hilar region. Stable peribronchovascular nodularity with some areas including RML, stable 9 x6 mm RLL pulmonary nodule CT  Chest 03/16/23 - Stable sarcoid findings CT Chest 06/23/23 - Unchanged/stable sarcoid findings  PFT: 07/17/20 FVC 3.45 (82%) FEV1 2.8 (83%) Ratio 76  TLC 92% DLCO 112% Interpretation: Normal PFTs. No significant BD response  04/15/20 FVC 3.35 (80%) FEV1 2.65 (79%) Ratio 74  TLC 95% DLCO 96% Interpretation: Normal PFTs. No significant change in FVC and FEV1. No significant BD response.  03/07/22 FVC 3.35 (80%) FEV1 2.69 (81%) Ratio 76  TLC 92% DLCO 101% Interpretation: Normal PFTs. Stable   Labs: CBC    Component Value Date/Time   WBC 6.2 05/09/2021 1638   RBC 4.90 05/09/2021 1638   HGB 14.3 05/09/2021 1638   HCT 44.4 05/09/2021 1638   PLT 322 05/09/2021 1638   MCV 91 05/09/2021 1638   MCH 29.2 05/09/2021 1638   MCHC 32.2 05/09/2021 1638   RDW 13.2 05/09/2021 1638   LYMPHSABS 1.4 05/09/2021 1638   EOSABS 0.2 05/09/2021 1638   BASOSABS 0.0 05/09/2021 1638   BMET    Component Value Date/Time   NA 140 05/09/2021 1638   K 4.4 05/09/2021 1638   CL 102 05/09/2021 1638   CO2 24 05/09/2021 1638   GLUCOSE 121 (H) 05/09/2021 1638   BUN 16 05/09/2021 1638   CREATININE 0.81 05/09/2021 1638   CALCIUM 8.9 05/09/2021 1638   EGFR 92 05/09/2021 1638   EKG/Echo: EKG 04/05/19 - Incomplete RBBB per report Echo - 05/14/2021 - EF 60 to 65%.  No WMA.  Normal diastolic.  Normal RV size and function.  No valvular abnormalities EKG 05/24/2021 - normal sinus rhythm.  Heart rate 83 PR interval 166 QTc 455  Pathology: [facility] LAB AP CASE REPORT:Surgical Pathology                                Case: [id number]                                  Authorizing Provider:  Toma Deiters, MD   Collected:           03/21/2019 1138              Ordering Location:     [facility] -     Received:            03/21/2019 1159                                     Operating Room  Pathologist:           Colin Broach, MD                                                         Intraop:               Colin Broach, MD                                                        Specimens:   A) - Lung, left lower lobe biopsy                                                                   B) - Lung, bronchus intermedius                                                                     C) - Lung, left upper lobe bipsy                                                                   D) - Trachea, tracheal biopsy                                                                       E) - Mediastinum , proximal 4R                                                                     F) - Lung, bronchus intermediate                                                      [facility] LAB AP PRE-OPERATIVE DIAGNOSIS:Lymphadenopathy  [facility] LAB AP PATHOLOGY PROCEDURES:Bronchoscopy, cervical mediastinoscopy, EBUS, EGD  Bronchoscopy w/EBUS  EGD  [facility] LAB AP FINAL DIAGNOSIS:  A. LEFT LOWER LOBE BIOPSY:   BRONCHIAL MUCOSA SHOWING GRANULATION TISSUE AND CHRONIC ACTIVE  INFLAMMATION.   B. BRONCHUS INTERMEDIUS, BIOPSY:   ULCERATED BRONCHIAL MUCOSA SHOWING GRANULATION TISSUE AND CHRONIC ACTIVE  INFLAMMATION.   NO ACID FAST OR FUNGAL ORGANISMS SEEN ON AFB, GMS, PAS AND MUCICARMINE  STAINS.     C.  LEFT UPPER LOBE BIOPSY:   BRONCHIAL MUCOSA SHOWING GRANULATION TISSUE AND CHRONIC ACTIVE  INFLAMMATION.   D.  TRACHEA, BIOPSY:   ULCERATED BRONCHIAL MUCOSA SHOWING GRANULATION TISSUE AND CHRONIC ACTIVE  INFLAMMATION.   E.  PROXIMAL 4R, EBUS-GUIDED BIOPSY:   LYMPH NODE TISSUE SHOWING CONFLUENT WELL FORMED EPITHELIOID GRANULOMAS,  FOCALLY NECROTIZING.   NO ACID FAST OR FUNGAL ORGANISMS SEEN ON AFB AND GMS STAINS.   NEGATIVE FOR TUMOR.   NOTE: THE APPEARANCE IS CONSISTENT WITH SARCOIDOSIS, HOWEVER, AN  INFECTIOUS PROCESS IS ALSO IN THE DIFFERENTIAL DIAGNOSIS.   F.  BRONCHUS INTERMEDIUS, BIOPSY:   ULCERATED BRONCHIAL MUCOSA SHOWING GRANULATION TISSUE AND  CHRONIC ACTIVE  INFLAMMATION.   Note: The airway biopsies show no evidence of malignancy and no viral  cytopathic effect is identified.     The case was referred to [facility] for  consultation.  Their report follows:   PATHOLOGIC DIAGNOSIS:  Consult slides from [facility], [place], [place]  [phone];  03/21/2019):   A.  Lung, left lower lobe, biopsy:    Bronchial mucosa with granulation tissue formation and admixed mixed  acute and chronic inflammation.   B. Lung, bronchus intermedius, frozen section biopsy:   Ulcerated bronchial mucosa with granulation tissue formation, admixed  mixed acute and chronic inflammation, fibrinopurulent exudate, and focus  of histiocytic aggregates.   Received stains for AFB, GMS, PAS, and mucicarmine are negative.   C.  Lung, left upper lobe, biopsy:   Inflamed bronchial mucosa with granulation tissue formation and admixed  mixed acute and chronic inflammation.   D.  Trachea, biopsy:   Ulcerated bronchial mucosa with granulation tissue formation, admixed  mixed acute and chronic inflammation, fibrinopurulent exudate and focus of  histiocytic aggregates.   E.  Mediastinum, lymph node, proximal 4R, biopsy:   Lymph node with confluent well-formed, focally necrotizing granulomas with  hyalinization, see note.   Note:  Received stains for GMS and AFB are negative for micro-organisms.    The differential diagnosis includes sarcoidosis and an infectious process,  clinical correlation recommended.   F.  Lung, bronchus intermediate, biopsy:   Ulcerated bronchial mucosa with granulation tissue formation, admixed  mixed acute and chronic inflammation, and fibrinopurulent exudate.   Electronically signed out:  Angelina Pih, M.D.   Date:  04/01/2019  [facility] Department of Pathology     Assessment & Plan:   Discussion: 48 year old female never smoker with sarcoid, allergic  rhinitis and reflux who presents for follow-up for sarcoid flare. Previously on prolonged steroid taper with improved symptoms and radiographic findings since 11/2021. Recurrent cough off steroids but cough improved on high dose ICS/LABA. CT chest stable off steroids. No indication for long term immunosuppressants at this time. Will continue surveillance imaging.  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 Last 02/2022 and stable  - Recommend annual ophthalmology exam.  Last visit on 2021.   Note: 02/18/22 - [facility] [phone] has attempted to reach patient 3 times on 11/2, 11/9, 11/16 and unable to schedule appointment. - Reviewed echo 04/2021 and EKG: Normal  Chronic  cough - possibly worsened by viral/bacterial URI but question recurrent sarcoid flare  --Failed Symbicort and Spiriva: Possible ADE with hypertension --CONTINUE Dulera 200-5 mcg TWO puffs in the morning and evening. Rinse out mouth after use. --CONTINUE Albuterol as needed for shortness of breath, wheezing and cough --OK to consider short course of prednisone if symptoms severe in the future - similar to asthma tx  High risk medication monitoring --Reviewed CT Chest with improving sarcoid - Previously completed taper in 2021 - Restart taper 11/2021 at 20 mg > 06/19/22 10 mg> 12/17/22 - 03/26/23 prednisone 5 mg> off end of 03/2023 --Reviewed CT March 2025 with stable sarcoid findings --Plan for repeat CT Chest without contrast in 1 year (March 2026). Will order at next visit   Health Maintenance Immunization History  Administered Date(s) Administered   Influenza, Seasonal, Injecte, Preservative Fre 12/31/2016, 12/21/2020   Influenza,inj,Quad PF,6+ Mos 12/31/2016, 12/21/2020, 12/13/2021   Influenza-Unspecified 12/31/2016, 11/23/2019, 12/21/2020, 12/13/2022   Moderna Sars-Covid-2 Vaccination 04/22/2019, 04/22/2019, 05/20/2019, 11/23/2019, 11/23/2019, 12/14/2019   Pfizer Covid-19 Vaccine  Bivalent Booster 10yrs & up 06/23/2021   Pfizer(Comirnaty)Fall Seasonal Vaccine 12 years and older 12/15/2021   Tdap 07/08/2016   CT Lung Screen- not qualified. Never smoker  No orders of the defined types were placed in this encounter.  Meds ordered this encounter  Medications   mometasone-formoterol (DULERA) 200-5 MCG/ACT AERO    Sig: Inhale 2 puffs into the lungs 2 (two) times daily.    Dispense:  1 each    Refill:  11   Return in about 8 months (around 03/01/2024).   I have spent a total time of 30-minutes on the day of the appointment including chart review, data review, collecting history, coordinating care and discussing medical diagnosis and plan with the patient/family. Past medical history, allergies, medications were reviewed. Pertinent imaging, labs and tests included in this note have been reviewed and interpreted independently by me.  Nimrit Kehres Mechele Collin, MD  Pulmonary Critical Care 07/01/2023 10:13 AM

## 2023-07-01 NOTE — Patient Instructions (Addendum)
 Sarcoid  Chronic cough --CONTINUE Dulera 200-5 mcg TWO puffs in the morning and evening. Rinse out mouth after use. --Reviewed CT March 2025 with stable sarcoid findings --Plan for repeat CT Chest without contrast in 1 year (March 2026). Will order at next visit

## 2024-01-24 ENCOUNTER — Other Ambulatory Visit: Payer: Self-pay | Admitting: Medical Genetics

## 2024-01-24 DIAGNOSIS — Z006 Encounter for examination for normal comparison and control in clinical research program: Secondary | ICD-10-CM

## 2024-05-19 ENCOUNTER — Ambulatory Visit (HOSPITAL_BASED_OUTPATIENT_CLINIC_OR_DEPARTMENT_OTHER): Admitting: Pulmonary Disease
